# Patient Record
Sex: Female | Born: 1980 | Race: Black or African American | Hispanic: No | Marital: Married | Smoking: Never smoker
Health system: Southern US, Community
[De-identification: ages and names within clinical notes are randomized; demographics above are authoritative.]

## PROBLEM LIST (undated history)

## (undated) DIAGNOSIS — I10 Essential (primary) hypertension: Secondary | ICD-10-CM

---

## 2015-05-21 ENCOUNTER — Encounter (HOSPITAL_BASED_OUTPATIENT_CLINIC_OR_DEPARTMENT_OTHER): Payer: Self-pay | Admitting: *Deleted

## 2015-05-21 ENCOUNTER — Emergency Department (HOSPITAL_BASED_OUTPATIENT_CLINIC_OR_DEPARTMENT_OTHER)
Admission: EM | Admit: 2015-05-21 | Discharge: 2015-05-21 | Disposition: A | Discharge status: Home / Self Care | Payer: Self-pay | Attending: Emergency Medicine | Admitting: Emergency Medicine

## 2015-05-21 ENCOUNTER — Emergency Department (HOSPITAL_BASED_OUTPATIENT_CLINIC_OR_DEPARTMENT_OTHER): Payer: Self-pay

## 2015-05-21 DIAGNOSIS — J0110 Acute frontal sinusitis, unspecified: Secondary | ICD-10-CM | POA: Insufficient documentation

## 2015-05-21 DIAGNOSIS — Z3202 Encounter for pregnancy test, result negative: Secondary | ICD-10-CM | POA: Insufficient documentation

## 2015-05-21 DIAGNOSIS — R519 Headache, unspecified: Secondary | ICD-10-CM

## 2015-05-21 DIAGNOSIS — R030 Elevated blood-pressure reading, without diagnosis of hypertension: Secondary | ICD-10-CM | POA: Insufficient documentation

## 2015-05-21 DIAGNOSIS — L0390 Cellulitis, unspecified: Secondary | ICD-10-CM

## 2015-05-21 DIAGNOSIS — IMO0001 Reserved for inherently not codable concepts without codable children: Secondary | ICD-10-CM

## 2015-05-21 DIAGNOSIS — R51 Headache: Secondary | ICD-10-CM

## 2015-05-21 LAB — BASIC METABOLIC PANEL
Anion gap: 6 (ref 5–15)
BUN: 5 mg/dL — AB (ref 6–20)
CO2: 26 mmol/L (ref 22–32)
CREATININE: 0.69 mg/dL (ref 0.44–1.00)
Calcium: 9.6 mg/dL (ref 8.9–10.3)
Chloride: 109 mmol/L (ref 101–111)
GFR calc Af Amer: >60 mL/min (ref >60)
Glucose, Bld: 93 mg/dL (ref 65–99)
Potassium: 3.5 mmol/L (ref 3.5–5.1)
SODIUM: 141 mmol/L (ref 135–145)

## 2015-05-21 LAB — CBC WITH DIFFERENTIAL/PLATELET
BASOS PCT: 0 %
Basophils Absolute: 0 10*3/uL (ref 0.0–0.1)
EOS ABS: 0.1 10*3/uL (ref 0.0–0.7)
EOS PCT: 2 %
HCT: 38.2 % (ref 36.0–46.0)
Hemoglobin: 12.5 g/dL (ref 12.0–15.0)
LYMPHS ABS: 2.6 10*3/uL (ref 0.7–4.0)
Lymphocytes Relative: 32 %
MCH: 26.2 pg (ref 26.0–34.0)
MCHC: 32.7 g/dL (ref 30.0–36.0)
MCV: 79.9 fL (ref 78.0–100.0)
MONOS PCT: 9 %
Monocytes Absolute: 0.7 10*3/uL (ref 0.1–1.0)
Neutro Abs: 4.7 10*3/uL (ref 1.7–7.7)
Neutrophils Relative %: 57 %
PLATELETS: 261 10*3/uL (ref 150–400)
RBC: 4.78 MIL/uL (ref 3.87–5.11)
RDW: 15.2 % (ref 11.5–15.5)
WBC: 8.2 10*3/uL (ref 4.0–10.5)

## 2015-05-21 LAB — PREGNANCY, URINE: Preg Test, Ur: NEGATIVE

## 2015-05-21 MED ORDER — FLUORESCEIN SODIUM 1 MG OP STRP
2.0000 | ORAL_STRIP | Freq: Once | OPHTHALMIC | Status: AC
  Administered 2015-05-21: 2 via OPHTHALMIC
  Filled 2015-05-21: qty 2

## 2015-05-21 MED ORDER — CLONIDINE HCL 0.1 MG PO TABS
0.1000 mg | ORAL_TABLET | Freq: Once | ORAL | Status: AC
  Administered 2015-05-21: 0.1 mg via ORAL
  Filled 2015-05-21: qty 1

## 2015-05-21 MED ORDER — AMOXICILLIN 500 MG PO CAPS: 1000.0000 mg | ORAL_CAPSULE | Freq: Two times a day (BID) | ORAL | Status: DC

## 2015-05-21 MED ORDER — SODIUM CHLORIDE 0.9 % IV BOLUS (SEPSIS)
1000.0000 mL | Freq: Once | INTRAVENOUS | Status: AC
  Administered 2015-05-21: 1000 mL via INTRAVENOUS

## 2015-05-21 MED ORDER — PROCHLORPERAZINE MALEATE 10 MG PO TABS: 10.0000 mg | ORAL_TABLET | Freq: Four times a day (QID) | ORAL | Status: DC | PRN

## 2015-05-21 MED ORDER — IOHEXOL 300 MG/ML  SOLN
80.0000 mL | Freq: Once | INTRAMUSCULAR | Status: AC | PRN
  Administered 2015-05-21: 80 mL via INTRAVENOUS

## 2015-05-21 MED ORDER — PROCHLORPERAZINE EDISYLATE 5 MG/ML IJ SOLN
10.0000 mg | Freq: Once | INTRAMUSCULAR | Status: AC
  Administered 2015-05-21: 10 mg via INTRAVENOUS
  Filled 2015-05-21: qty 2

## 2015-05-21 MED ORDER — TETRACAINE HCL 0.5 % OP SOLN
2.0000 [drp] | Freq: Once | OPHTHALMIC | Status: AC
  Administered 2015-05-21: 2 [drp] via OPHTHALMIC
  Filled 2015-05-21: qty 4

## 2015-05-21 NOTE — ED Provider Notes (Signed)
CSN: [id number]     Arrival date & time 05/21/15  1259 History   First MD Initiated Contact with Patient 05/21/15 1352     Chief Complaint  Patient presents with  . Headache     (Consider location/radiation/quality/duration/timing/severity/associated sxs/prior Treatment) HPI  Blood pressure 161/108, pulse 80, temperature 98.4 F (36.9 C), temperature source Oral, resp. rate 16, height 5\' 4"  (1.626 m), weight 81.194 kg, last menstrual period 05/12/2015, SpO2 100 %.  Ranyah Groeneveld is a 35 y.o. female complaining of severe bilateral temporal headache, rated at 8 out of 10, pain is severe and wakes her from sleep at night, patient has history of headaches but none of this character or severity. She's been taking Aleve at home with little relief. She denies change in vision, fever, chills, cervicalgia, thunderclap onset, dysarthria, ataxia, cervicalgia. Patient does not have a diagnosis of hypertension, primary care is at [facility]. She does wear contact lenses.  States that when she woke up this morning, the right eye was swollen but this is improved throughout the day  History reviewed. No pertinent past medical history. Past Surgical History  Procedure Laterality Date  . Cesarean section     No family history on file. Social History  Substance Use Topics  . Smoking status: Never Smoker   . Smokeless tobacco: None  . Alcohol Use: No   OB History    No data available     Review of Systems  10 systems reviewed and found to be negative, except as noted in the HPI.   Allergies  Review of patient's allergies indicates no known allergies.  Home Medications   Prior to Admission medications   Not on File   BP 161/108 mmHg  Pulse 80  Temp(Src) 98.4 F (36.9 C) (Oral)  Resp 16  Ht 5\' 4"  (1.626 m)  Wt 81.194 kg  BMI 30.71 kg/m2  SpO2 100%  LMP 05/12/2015 Physical Exam  Constitutional: She is oriented to person, place, and time. She appears well-developed and  well-nourished. No distress.  HENT:  Head: Normocephalic and atraumatic.  Mouth/Throat: Oropharynx is clear and moist.  No drooling or stridor. Posterior pharynx mildly erythematous no significant tonsillar hypertrophy. No exudate. Soft palate rises symmetrically. No TTP or induration under tongue.   + Frontal sinus tenderness  No tenderness to palpation bilateral maxillary sinuses.  No mucosal edema in the nares.  Bilateral tympanic membranes with normal architecture and good light reflex.    Eyes: Conjunctivae and EOM are normal. Pupils are equal, round, and reactive to light.  Right eye pain with extraocular movement, lids and lashes normal. No conjunctival injection, no discharge, no abnormal uptake on fluorescein stain, corrected visual acuity 20 out of 20 bilaterally. Extraocular pressure is 19 mmHg with a 95% confidence interval.  Neck: Normal range of motion. Neck supple.  FROM to C-spine. Pt can touch chin to chest without discomfort. No TTP of midline cervical spine.   Cardiovascular: Normal rate, regular rhythm and intact distal pulses.   Pulmonary/Chest: Effort normal and breath sounds normal. No stridor. No respiratory distress. She has no wheezes. She has no rales. She exhibits no tenderness.  Abdominal: Soft. Bowel sounds are normal. She exhibits no distension and no mass. There is no tenderness. There is no rebound and no guarding.  Musculoskeletal: Normal range of motion. She exhibits no edema or tenderness.  Neurological: She is alert and oriented to person, place, and time. No cranial nerve deficit.  II-Visual fields grossly intact. III/IV/VI-Extraocular movements intact.  Pupils reactive bilaterally. V/VII-Smile symmetric, equal eyebrow raise,  facial sensation intact VIII- Hearing grossly intact IX/X-Normal gag XI-bilateral shoulder shrug XII-midline tongue extension Motor: 5/5 bilaterally with normal tone and bulk Cerebellar: Normal finger-to-nose  and normal  heel-to-shin test.   Romberg negative Ambulates with a coordinated gait   Psychiatric: She has a normal mood and affect.  Nursing note and vitals reviewed.   ED Course  Procedures (including critical care time) Labs Review Labs Reviewed - No data to display  Imaging Review No results found. I have personally reviewed and evaluated these images and lab results as part of my medical decision-making.   EKG Interpretation None      MDM   Final diagnoses:  None    Filed Vitals:   05/21/15 1306 05/21/15 1548 05/21/15 1630 05/21/15 1642  BP: 161/108 160/111 171/123 171/123  Pulse: 80 89    Temp: 98.4 F (36.9 C)     TempSrc: Oral     Resp: 16 17    Height: 5\' 4"  (1.626 m)     Weight: 81.194 kg     SpO2: 100% 99%      Medications  sodium chloride 0.9 % bolus 1,000 mL (not administered)  prochlorperazine (COMPAZINE) injection 10 mg (not administered)  fluorescein ophthalmic strip 2 strip (not administered)  tetracaine (PONTOCAINE) 0.5 % ophthalmic solution 2 drop (not administered)    Orrin Brighamatasha Mcpherson is 35 y.o. female presenting with  severe bilateral temporal headache, patient reports pain with eye movement, states that when she woke up this a.m. her right eye was swollen, this is resolved over the course of the day. Neuro exam is nonfocal, considering the atypical nature of this patient's headache I will scan her head and CT orbits with contrast to rule out a right sided orbital cellulitis. Patient's headache has completely resolved with fluids and Compazine. I exam is reassuring with normal acuity, pressure, anterior chamber with no cells or flare, no abnormal uptake of fluorescein stain. CT negative for acute abnormality. This patient's blood pressure is significantly elevated, she states that she's had elevated blood pressure in the past but has never been prescribed any medications for hypertension. The blood pressure remains elevated even after her headache has completely  resolved. Patient is given a dose of clonidine in the ED, discussed with Dr. Rosalia Hammersay who agrees patient stable for discharge, she can follow with her primary care physician tomorrow a.m. We've had an excessive discussion of return precautions and patient verbalizes her understanding. Patient has some tenderness palpation along the frontal sinuses, we'll treat for sinusitis.  Evaluation does not show pathology that would require ongoing emergent intervention or inpatient treatment. Pt is hemodynamically stable and mentating appropriately. Discussed findings and plan with patient/guardian, who agrees with care plan. All questions answered. Return precautions discussed and outpatient follow up given.   Discharge Medication List as of 05/21/2015  4:19 PM    START taking these medications   Details  amoxicillin (AMOXIL) 500 MG capsule Take 2 capsules (1,000 mg total) by mouth 2 (two) times daily., Starting 05/21/2015, Until Discontinued, [employer]    prochlorperazine (COMPAZINE) 10 MG tablet Take 1 tablet (10 mg total) by mouth every 6 (six) hours as needed (For headache)., Starting 05/21/2015, Until Discontinued, [employer]             Roshunda Keir, PA-C 05/21/15 1649  Rolan BuccoMelanie Belfi, MD 05/22/15 2109

## 2015-05-21 NOTE — ED Notes (Signed)
Patient asked to change into a gown.  

## 2015-05-21 NOTE — ED Notes (Signed)
Headache x 3 days. Chills. Her right eye was swollen when she woke up this am.

## 2015-05-21 NOTE — Discharge Instructions (Signed)
Use nasal saline (you can try Arm and Hammer Simply Saline) at least 4 times a day, use saline 5-10 minutes before using the fluticasone (flonase) nasal spray  Do not use Afrin (Oxymetazoline)  Rest, wash hands frequently  and drink plenty of water.  You may try counter medication such as Mucinex or Sudafed decongestant.  Please follow with your primary care doctor in the next 5 days for high blood pressure evaluation. If you do not have a primary care doctor, present to urgent care. Reduce salt intake. Seek emergency medical care for unilateral weakness, slurring, change in vision, or chest pain and shortness of breath.  Do not hesitate to return to the emergency room for any new, worsening or concerning symptoms.  Please obtain primary care using resource guide below. Let them know that you were seen in the emergency room and that they will need to obtain records for further outpatient management.     Sinus Headache A sinus headache occurs when the paranasal sinuses become clogged or swollen. Paranasal sinuses are air pockets within the bones of the face. Sinus headaches can range from mild to severe. CAUSES A sinus headache can result from various conditions that affect the sinuses, such as:  Colds.  Sinus infections.  Allergies. SYMPTOMS The main symptom of this condition is a headache that may feel like pain or pressure in the face, forehead, ears, or upper teeth. People who have a sinus headache often have other symptoms, such as:  Congested or runny nose.  Fever.  Inability to smell. Weather changes can make symptoms worse. DIAGNOSIS This condition may be diagnosed based on:  A physical exam and medical history.  Imaging tests, such as a CT scan and MRI, to check for problems with the sinuses.  A specialist may look into the sinuses with a tool that has a camera (endoscopy). TREATMENT Treatment for this condition depends on the cause.  Sinus pain that is caused  by a sinus infection may be treated with antibiotic medicine.  Sinus pain that is caused by allergies may be helped by allergy medicines (antihistamines) and medicated nasal sprays.  Sinus pain that is caused by congestion may be helped by flushing the nose and sinuses with saline solution. HOME CARE INSTRUCTIONS  Take medicines only as directed by your health care provider.  If you were prescribed an antibiotic medicine, finish all of it even if you start to feel better.  If you have congestion, use a nasal spray to help reduce pressure.  If directed, apply a warm, moist washcloth to your face to help relieve pain. SEEK MEDICAL CARE IF:  You have headaches more than one time each week.  You have sensitivity to light or sound.  You have a fever.  You feel sick to your stomach (nauseous) or you throw up (vomit).  Your headaches do not get better with treatment. Many people think that they have a sinus headache when they actually have migraines or tension headaches. SEEK IMMEDIATE MEDICAL CARE IF:  You have vision problems.  You have sudden, severe pain in your face or head.  You have a seizure.  You are confused.  You have a stiff neck.   This information is not intended to replace advice given to you by your health care provider. Make sure you discuss any questions you have with your health care provider.   Document Released: 06/12/2004 Document Revised: 09/19/2014 Document Reviewed: 05/01/2014 Elsevier Interactive Patient Education 2016 [employer]. Emergency Department Resource Guide  1) Find a Librarian, academic and Pay Out of Pocket Although you won't have to find out who is covered by your insurance plan, it is a good idea to ask around and get recommendations. You will then need to call the office and see if the doctor you have chosen will accept you as a new patient and what types of options they offer for patients who are self-pay. Some doctors offer discounts or will set up  payment plans for their patients who do not have insurance, but you will need to ask so you aren't surprised when you get to your appointment.  2) Contact Your Local Health Department Not all health departments have doctors that can see patients for sick visits, but many do, so it is worth a call to see if yours does. If you don't know where your local health department is, you can check in your phone book. The CDC also has a tool to help you locate your state's health department, and many state websites also have listings of all of their local health departments.  3) Find a Walk-in Clinic If your illness is not likely to be very severe or complicated, you may want to try a walk in clinic. These are popping up all over the country in pharmacies, drugstores, and shopping centers. They're usually staffed by nurse practitioners or physician assistants that have been trained to treat common illnesses and complaints. They're usually fairly quick and inexpensive. However, if you have serious medical issues or chronic medical problems, these are probably not your best option.  No Primary Care Doctor: - Call Health Connect at  [phone] - they can help you locate a primary care doctor that  accepts your insurance, provides certain services, etc. - Physician Referral Service- [phone]  Chronic Pain Problems: Organization         Address  Phone   Notes  [facility]  [phone] Patients need to be referred by their primary care doctor.   Medication Assistance: Organization         Address  Phone   Notes  [facility] Medication [facility] [address] [place], [place] [address] [phone] --Must be a resident of [facility] -- Must have NO insurance coverage whatsoever (no Medicaid/ Medicare, etc.) -- The pt. MUST have a primary care doctor that directs their care regularly and follows them in the community   [facility]ist  [phone]   [employer]  [phone]    Agencies that provide inexpensive medical care: Organization         Address  Phone   Notes  [facility]  [phone]   [facility]    [phone]   [facility] [address] [place], [place] [address] [phone]   [facility] [address] [place] [phone]   Planned Parenthood    [phone]   [facility]    [phone]   Community Health and [facility]  [address], [place] Phone:  [phone], Fax:  [phone] Hours of Operation:  9 am - 6 pm, M-F.  Also accepts Medicaid/Medicare and self-pay.  [facility]  [address], [place] Phone: [phone], Fax: [phone]. Hours of Operation:  8:30 am - 5:30 pm, M-F.  Also accepts Medicaid and self-pay.  [facility] [place] [address], [place] Phone: [phone]   [facility] [address] Aaliayah Bence [place], [place] [phone], Ext. [phone] Mondays & Thursdays: 7-9 AM.  First 15 patients are seen on a first come, first serve basis.    Medicaid-accepting [facility] Providers:  Organization         Address  Phone   Notes  [facility] [address], [place] [phone] Also accepts self-pay patients.  [facility] [address] Laurell Josephs [place], [place]  [phone]   [facility] [address], [place] [phone]   [facility] [address], [place] [phone]   Renaye Rakers [address], [place]   [phone] Only accepts [place] Access [place] patients after they have their name applied to their card.   Self-Pay (no insurance) in [facility]:  Organization         Address  Phone   Notes  Sickle Cell Patients, [facility] [address] [place], [place]  [phone]   [facility] [address] [place], [place] [phone]   [facility] [place]  [address], [place] [phone]   [facility]/Dr. Osei-Bonsu  [address] [place] or [address], [place] [phone] Phone number for both [place] and [place] locations is the same.  Urgent Medical and [facility] [address] [place] [phone]   [facility] [address], [place] or [address] [phone]   [facility] [address] [place] [phone], phone; [phone], fax Sees patients 1st and 3rd Saturday of every month.  Must not qualify for public or private insurance (i.e. Medicaid, Medicare, [place] Health Choice, Veterans' Benefits)  Household income should be no more than 200% of the poverty level The clinic cannot treat you if you are pregnant or think you are pregnant  Sexually transmitted diseases are not treated at the clinic.    Dental Care: Organization         Address  Phone  Notes  [facility] [address] [place], [place] [phone] Accepts children up to age 27 who are enrolled in [place] or [place] Health Choice; pregnant women with a Medicaid card; and children who have applied for Medicaid or [place] Health Choice, but were declined, whose parents can pay a reduced fee at time of service.  [facility] of [facility]  [address], [place] [phone] Accepts children up to age 64 who are enrolled in [place] or [place] Health Choice; pregnant women with a Medicaid card; and children who have applied for Medicaid or [place] Health Choice, but were declined, whose parents can pay a reduced fee at time of service.  [facility]  [address] [place], [place] [phone] Patients  are seen by appointment only. Walk-ins are not accepted. [facility] will see patients 65 years of age and older. Monday - Tuesday (8am-5pm) Most Wednesdays (8:30-5pm) $30 per visit, cash only  [facility] Adult [facility] Access [facility]  [address], [facility] [phone] Patients are seen by appointment only. Walk-ins are not accepted. [facility] will see patients 56 years of age and older. One Wednesday Evening (Monthly:  Volunteer Based).  $30 per visit, cash only  [employer] [facility]  [phone] for adults; Children under age 704, call Graduate Pediatric Dentistry at [phone]. Children aged 114-14, please call [phone]) [phone] to request a pediatric application.  Dental services are provided in all areas of dental care including fillings, crowns and bridges, complete and partial dentures, implants, gum treatment, root canals, and extractions. Preventive care is also provided. Treatment is provided to both adults and children. Patients are selected via a lottery and there is often a waiting list.   [facility] [address], [place]  [phone] [email or web address]   [facility] [address], [place], [place] [phone], Ext. [phone] Second and Fourth Thursday of each month, opens at 6:30 AM; Clinic ends at 9 AM.  Patients are seen on a first-come first-served basis, and a limited number are seen during each clinic.   [facility]  [address], [place], [place] [phone]   Eligibility Requirements You must have lived in [place], [place], or [place] for at least the last three months.   You cannot be eligible for state or federal sponsored [place]healthcare insurance, including CIG[employer]eterans Administration, [place]icaid, or [employer]icare.   You generally cannot be eligible for healthcare insurance through your employer.    How to apply: Eligibility screenings are held every Tuesday and Wednesday afternoon from 1:00 pm until  4:00 pm. You do not need an appointment for the interview!  [facility] [address], [place], [place] [phone]   [facility]  [phone]   [facility]  [phone]   [facility]  [phone]    Behavioral Health Resources in the Community: Intensive Outpatient Programs Organization         Address  Phone  Notes  [facility] [address], [place], [place] [phone]   [facility] [address], [place], [place] [phone]   [facility] [address], [place], [place]  [phone]   [facility] [address],  [place], [place] [phone] or [phone]   Substance Abuse Resources Organization         Address  Phone  Notes  [facility]  [phone]   [facility]  [phone]   [facility]  [phone]   [facility]  [phone]   Residential & Outpatient Substance Abuse Program  [phone]   Psychological Services Organization         Address  Phone  Notes  [facility]  [phone]- [phone]   [facility]  [phone]- [phone]   [facility] [address], [place] [phone] or [phone]    Mobile Crisis Teams Organization         Address  Phone  Notes  [facility], Mobile Crisis Care Unit  [phone]   [facility]  [address] [place], [place] [phone]   Doristine LocksSharon DeEsch [address] [place] [phone]    Self-Help/Support Groups Organization         Address  Phone             Notes  [facility] [place] - variety of support groups  336- [phone] Call for more information  Narcotics Anonymous (NA), [facility]aring [facility] [address], [place]  2 meetings at this location   Residential Treatment  Programs Organization          Address  Phone  Notes  [facility] [address],    [place]  [phone]   [facility]  [address], [place] [address], [place], [place] [phone]   [facility] [address] [place], [place] [phone] Admissions: 8am-3pm M-F  [facility] [address],    [place], [place] [phone]   [facility] [address] [place], [place], [place] [phone]   [facility] [address],  [place], [place] [phone]   [facility] - Intensive Outpatient [address]ure[address] Joseph[address], [place], [place] [phone]   [facility] ([facility] [address] [place].,  [place], [place] [phone] or [phone]   [facility] ([facility]) [address] [place], [place] [phone] Accepts Medicaid  [facility] [address],  [place] [phone] Substance Abuse/Addiction Treatment   [facility] Organization         Address  Phone  Notes  [facility]  [phone]   Angie Fava, PhD [address] Ervin Knack [place], [place]   [phone] or [phone]   [facility]   [address] [place], [place] [phone]   [facility] [address], [place], [place] [phone] Insurance/Medicaid/sponsorship through [facility] [address]                                    [place], [place] [phone] Therapy/tele-psych/case  [facility] [address], [place] [phone]    Dr. Lolly Mustache  [phone]   [facility]  [employer] [facility] Dept. 1) [address] [place] 2) [address] [place] 3)  [address], [place] [phone]  [phone]   [facility] Child Abuse Hotline 940-411-9993 or [phone] (After Hours)

## 2015-05-21 NOTE — ED Notes (Signed)
Pt. Able to walk steady gait and speak clear speech with no difficulty swallowing. Pt. Reports no trouble eating or drinking just headache.  Pt. Wears contacts and drove self to ED today.

## 2017-01-06 IMAGING — CT CT HEAD W/O CM
2 of 4 series · 13 of 47 positions shown, 16 images · non-contrast
Comparison: None.

CLINICAL DATA: RIGHT eye swallowing since earlier today. Evaluate
for abscess.

EXAM:
CT HEAD AND ORBITS WITHOUT CONTRAST
TECHNIQUE: Contiguous axial images were obtained from the base of the skull
through the vertex without contrast. Multidetector CT imaging of the
orbits was performed using the standard protocol without intravenous
contrast.

[Series 2: head wo · axial · 0.43mm/px · z∈[+1134,+1260]mm · 10 of 33 slices shown, 13 images]
[im 3/33  brain]
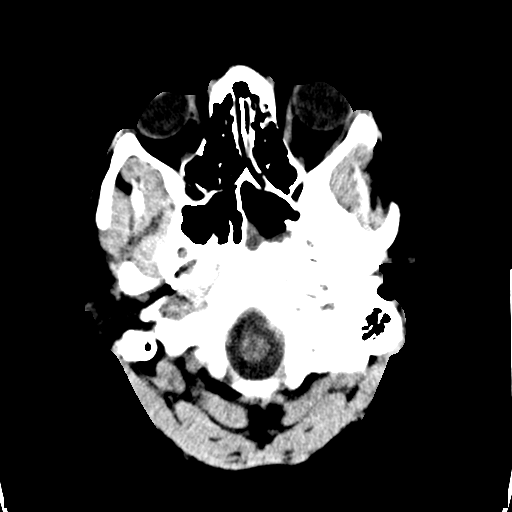
[im 3/33  bone]
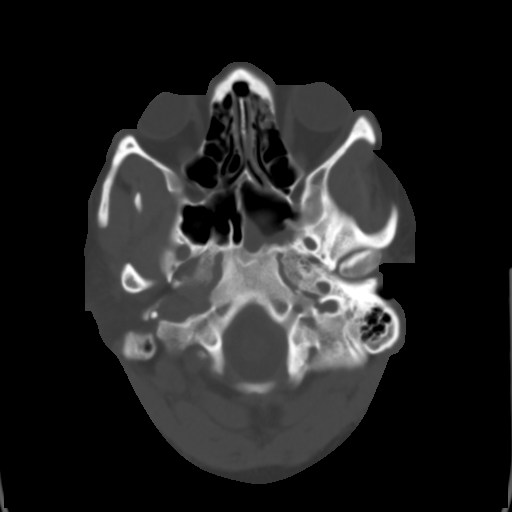
[im 6/33  brain]
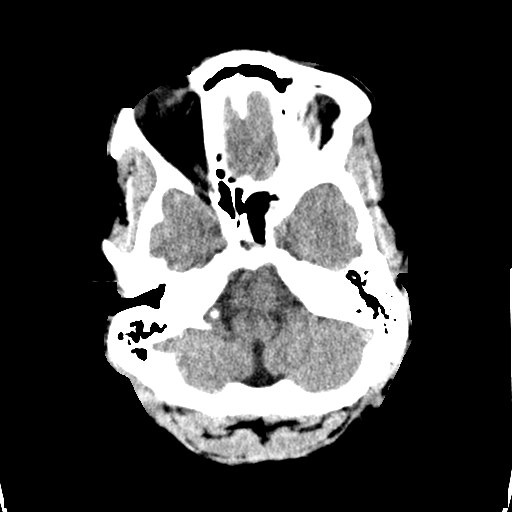
[im 9/33  brain]
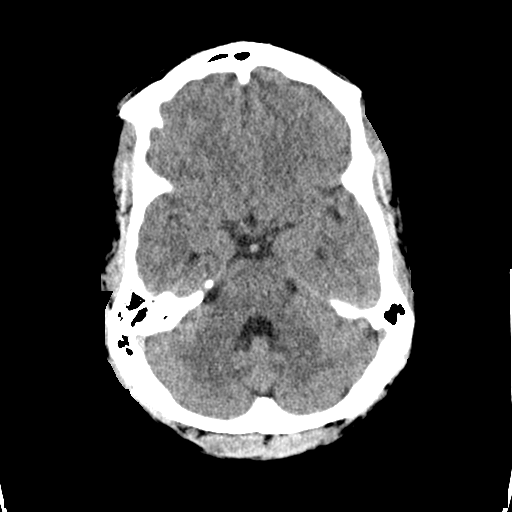
[im 11/33  brain]
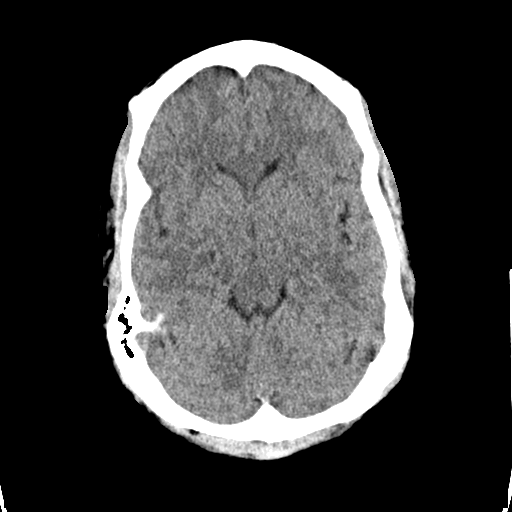
[im 14/33  brain]
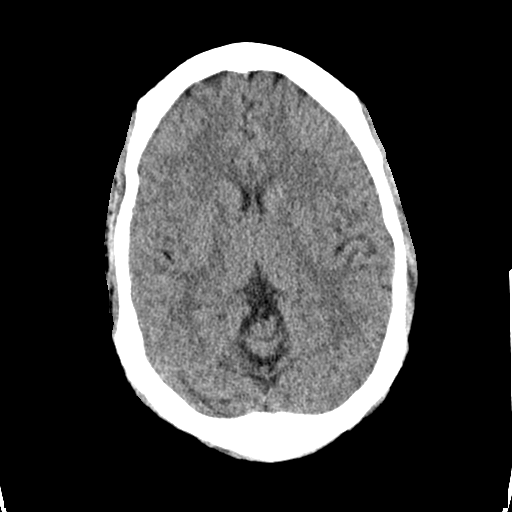
[im 14/33  bone]
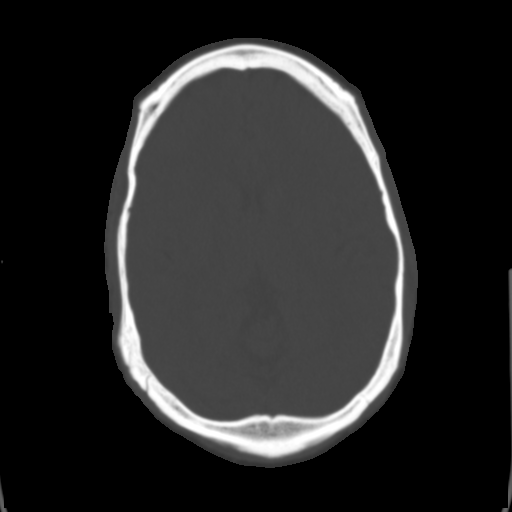
[im 19/33  brain]
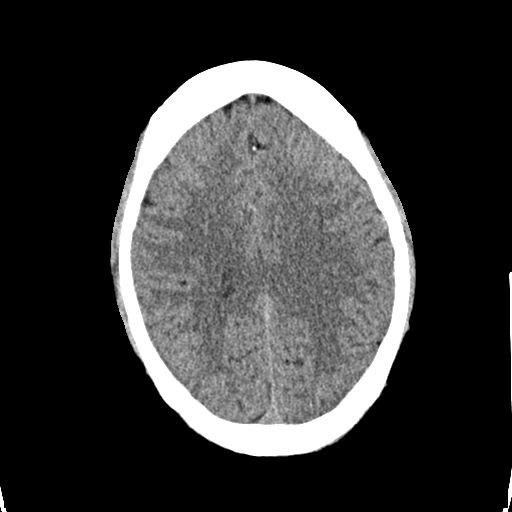
[im 22/33  brain]
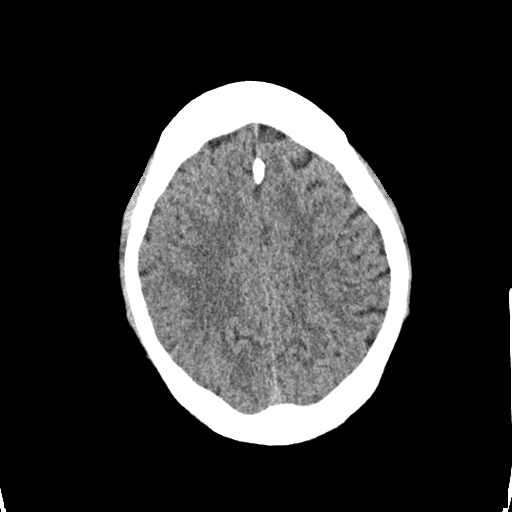
[im 25/33  brain]
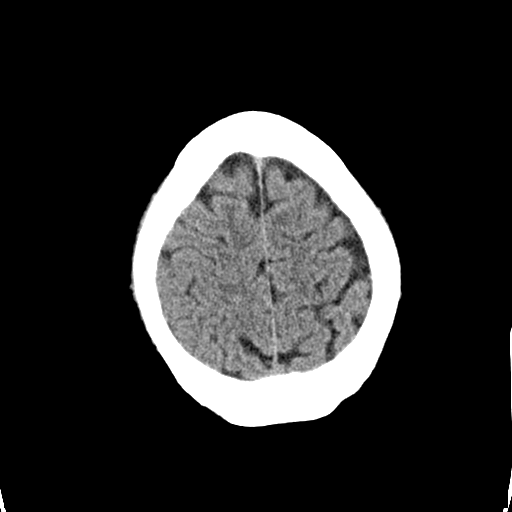
[im 27/33  brain]
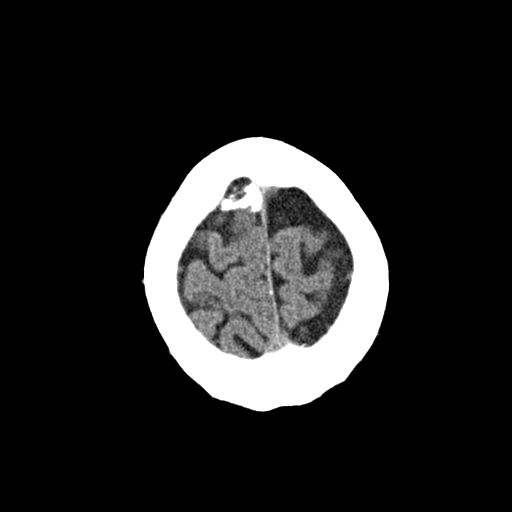
[im 27/33  bone]
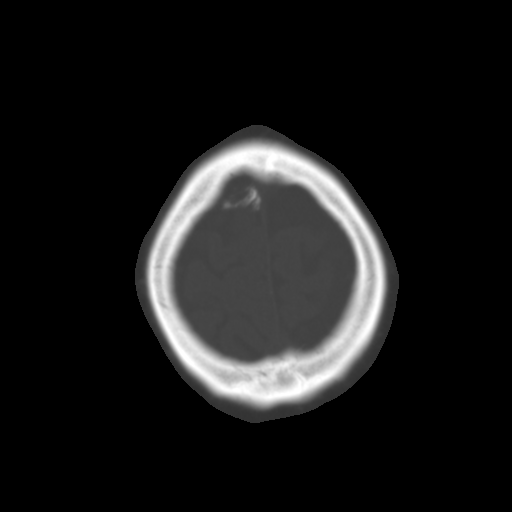
[im 30/33  brain]
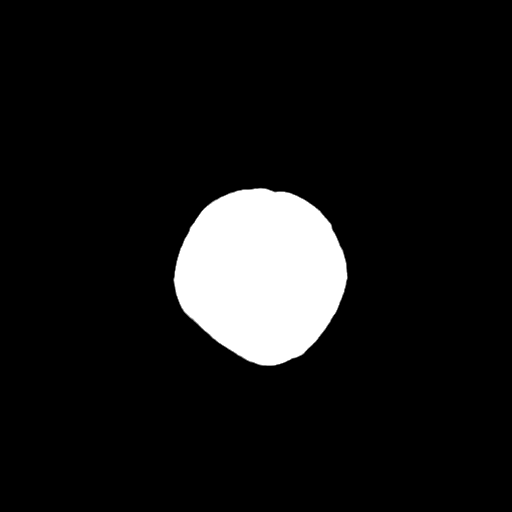

[Series 10: orbits 2.0 coronal · coronal · 0.18mm/px · 3 of 63 slices shown]
[im 21/63  brain]
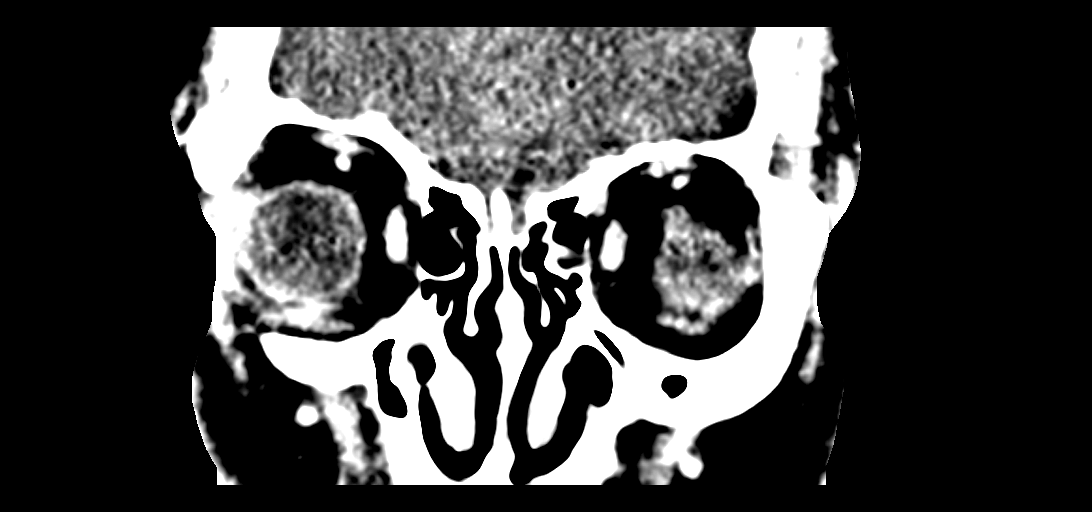
[im 28/63  brain]
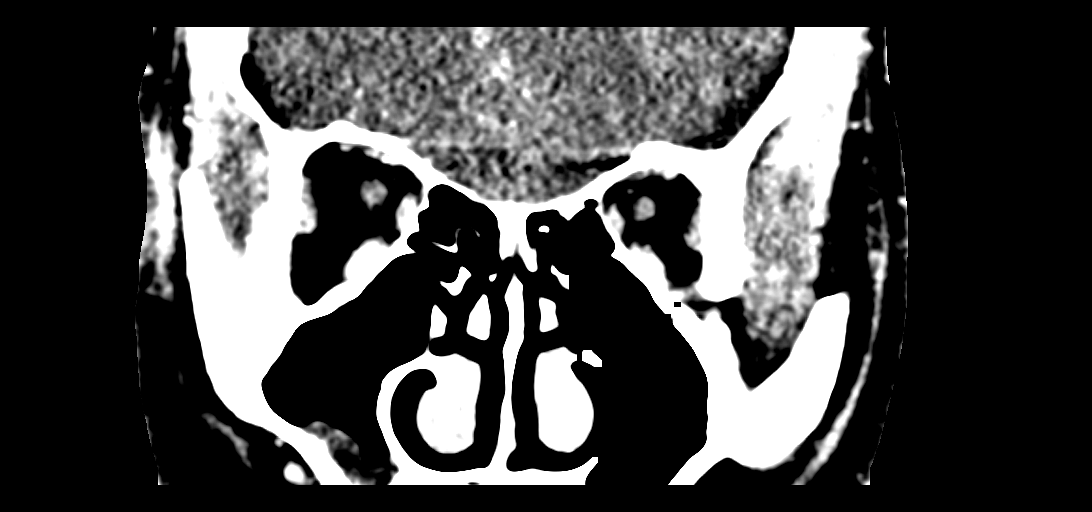
[im 35/63  brain]
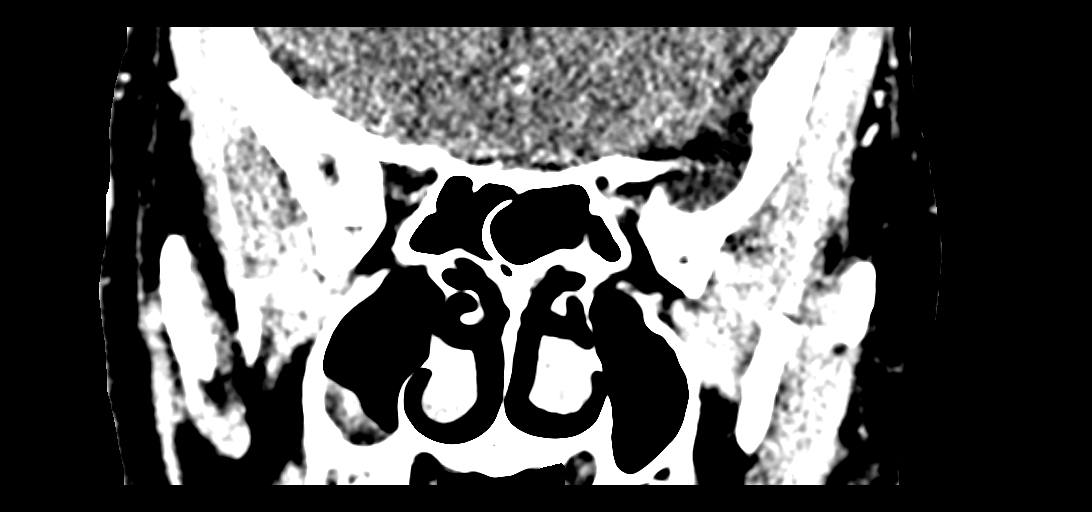

[13 of 47 positions shown; findings below may reference images not displayed]

FINDINGS: CT HEAD FINDINGS

No evidence for acute infarction, hemorrhage, mass lesion,
hydrocephalus, or extra-axial fluid. Normal cerebral volume. No
white matter disease. Calvarium is intact. Chronic LEFT division
sinus disease. Mild mucosal thickening in the LEFT ethmoid air
cells. BILATERAL chronic RIGHT greater than LEFT maxillary sinus
disease.

CT ORBITS FINDINGS

Globes are symmetric. Normal orbital musculature. No subperiosteal
or orbital abscess. No intraconal inflammation. Gaze appears
conjugate and both lens appear normally located. No orbital masses.
No enlargement of the superior ophthalmic vein on the RIGHT or LEFT.
Visualized intracranial optic pathways grossly negative.

No appreciable swelling on the RIGHT or LEFT. No preseptal
inflammation is present. No facial abscess or significant fat
stranding is observed. No evidence for blowout injury.
IMPRESSION: No acute intracranial findings.

No evidence for orbital or subperiosteal abscess. No orbital masses
are seen. No evidence for orbital inflammation.

Mild chronic sinus disease.

No preseptal soft tissue swelling or other facial inflammatory
process is observed.

## 2020-07-17 HISTORY — PX: SHOULDER SURGERY: SHX246

## 2020-07-28 ENCOUNTER — Observation Stay (HOSPITAL_COMMUNITY)
Admission: EM | Admit: 2020-07-28 | Discharge: 2020-07-29 | Disposition: A | Discharge status: Home / Self Care | Payer: PRIVATE HEALTH INSURANCE | Attending: Orthopedic Surgery | Admitting: Orthopedic Surgery

## 2020-07-28 ENCOUNTER — Emergency Department (HOSPITAL_COMMUNITY): Payer: Self-pay | Admitting: Anesthesiology

## 2020-07-28 ENCOUNTER — Encounter (HOSPITAL_COMMUNITY): Payer: Self-pay | Admitting: Emergency Medicine

## 2020-07-28 ENCOUNTER — Encounter (HOSPITAL_COMMUNITY)
Admission: EM | Disposition: A | Discharge status: Home / Self Care | Payer: Self-pay | Source: Home / Self Care | Attending: Emergency Medicine

## 2020-07-28 ENCOUNTER — Emergency Department (HOSPITAL_COMMUNITY): Payer: Self-pay

## 2020-07-28 ENCOUNTER — Other Ambulatory Visit: Payer: Self-pay

## 2020-07-28 DIAGNOSIS — S71152A Open bite, left thigh, initial encounter: Secondary | ICD-10-CM

## 2020-07-28 DIAGNOSIS — S81852A Open bite, left lower leg, initial encounter: Secondary | ICD-10-CM | POA: Insufficient documentation

## 2020-07-28 DIAGNOSIS — W540XXA Bitten by dog, initial encounter: Secondary | ICD-10-CM | POA: Diagnosis not present

## 2020-07-28 DIAGNOSIS — Z79899 Other long term (current) drug therapy: Secondary | ICD-10-CM | POA: Insufficient documentation

## 2020-07-28 DIAGNOSIS — S41151A Open bite of right upper arm, initial encounter: Secondary | ICD-10-CM

## 2020-07-28 DIAGNOSIS — S41051A Open bite of right shoulder, initial encounter: Principal | ICD-10-CM | POA: Insufficient documentation

## 2020-07-28 DIAGNOSIS — Z9889 Other specified postprocedural states: Secondary | ICD-10-CM

## 2020-07-28 DIAGNOSIS — Z20822 Contact with and (suspected) exposure to covid-19: Secondary | ICD-10-CM | POA: Insufficient documentation

## 2020-07-28 DIAGNOSIS — I10 Essential (primary) hypertension: Secondary | ICD-10-CM | POA: Insufficient documentation

## 2020-07-28 DIAGNOSIS — Z23 Encounter for immunization: Secondary | ICD-10-CM | POA: Insufficient documentation

## 2020-07-28 HISTORY — PX: I & D EXTREMITY: SHX5045

## 2020-07-28 LAB — CBC WITH DIFFERENTIAL/PLATELET
Abs Immature Granulocytes: 0.04 10*3/uL (ref 0.00–0.07)
Basophils Absolute: 0.1 10*3/uL (ref 0.0–0.1)
Basophils Relative: 1 %
Eosinophils Absolute: 0.2 10*3/uL (ref 0.0–0.5)
Eosinophils Relative: 2 %
HCT: 37.7 % (ref 36.0–46.0)
Hemoglobin: 12.4 g/dL (ref 12.0–15.0)
Immature Granulocytes: 0 %
Lymphocytes Relative: 32 %
Lymphs Abs: 4.2 10*3/uL — ABNORMAL HIGH (ref 0.7–4.0)
MCH: 26.4 pg (ref 26.0–34.0)
MCHC: 32.9 g/dL (ref 30.0–36.0)
MCV: 80.2 fL (ref 80.0–100.0)
Monocytes Absolute: 1.3 10*3/uL — ABNORMAL HIGH (ref 0.1–1.0)
Monocytes Relative: 10 %
Neutro Abs: 7.3 10*3/uL (ref 1.7–7.7)
Neutrophils Relative %: 55 %
Platelets: 280 10*3/uL (ref 150–400)
RBC: 4.7 MIL/uL (ref 3.87–5.11)
RDW: 16.7 % — ABNORMAL HIGH (ref 11.5–15.5)
WBC: 13 10*3/uL — ABNORMAL HIGH (ref 4.0–10.5)
nRBC: 0 % (ref 0.0–0.2)

## 2020-07-28 LAB — BASIC METABOLIC PANEL
Anion gap: 10 (ref 5–15)
BUN: 7 mg/dL (ref 6–20)
CO2: 22 mmol/L (ref 22–32)
Calcium: 9.7 mg/dL (ref 8.9–10.3)
Chloride: 104 mmol/L (ref 98–111)
Creatinine, Ser: 0.69 mg/dL (ref 0.44–1.00)
GFR, Estimated: >60 mL/min (ref >60)
Glucose, Bld: 113 mg/dL — ABNORMAL HIGH (ref 70–99)
Potassium: 3.2 mmol/L — ABNORMAL LOW (ref 3.5–5.1)
Sodium: 136 mmol/L (ref 135–145)

## 2020-07-28 LAB — RESP PANEL BY RT-PCR (FLU A&B, COVID) ARPGX2
Influenza A by PCR: NEGATIVE
Influenza B by PCR: NEGATIVE
SARS Coronavirus 2 by RT PCR: NEGATIVE

## 2020-07-28 LAB — I-STAT BETA HCG BLOOD, ED (MC, WL, AP ONLY): I-stat hCG, quantitative: <5 m[IU]/mL (ref <5)

## 2020-07-28 SURGERY — IRRIGATION AND DEBRIDEMENT EXTREMITY
Anesthesia: General | Site: Shoulder | Laterality: Bilateral

## 2020-07-28 MED ORDER — LORAZEPAM 2 MG/ML IJ SOLN: 2.0000 mg | Freq: Once | INTRAMUSCULAR | Status: DC

## 2020-07-28 MED ORDER — FENTANYL CITRATE (PF) 100 MCG/2ML IJ SOLN
50.0000 ug | Freq: Once | INTRAMUSCULAR | Status: AC
  Administered 2020-07-28: 50 ug via INTRAVENOUS
  Filled 2020-07-28: qty 2

## 2020-07-28 MED ORDER — ONDANSETRON HCL 4 MG/2ML IJ SOLN
INTRAMUSCULAR | Status: DC | PRN
  Administered 2020-07-28: 4 mg via INTRAVENOUS

## 2020-07-28 MED ORDER — CLONIDINE HCL (ANALGESIA) 100 MCG/ML EP SOLN
EPIDURAL | Status: AC
  Filled 2020-07-28: qty 10

## 2020-07-28 MED ORDER — LORAZEPAM 2 MG/ML IJ SOLN
1.0000 mg | Freq: Once | INTRAMUSCULAR | Status: AC
  Administered 2020-07-28: 1 mg via INTRAVENOUS
  Filled 2020-07-28: qty 1

## 2020-07-28 MED ORDER — FENTANYL CITRATE (PF) 250 MCG/5ML IJ SOLN
INTRAMUSCULAR | Status: DC | PRN
  Administered 2020-07-28: 50 ug via INTRAVENOUS
  Administered 2020-07-28 (×2): 100 ug via INTRAVENOUS

## 2020-07-28 MED ORDER — ALBUMIN HUMAN 5 % IV SOLN: INTRAVENOUS | Status: DC | PRN

## 2020-07-28 MED ORDER — LIDOCAINE 2% (20 MG/ML) 5 ML SYRINGE
INTRAMUSCULAR | Status: AC
  Filled 2020-07-28: qty 10

## 2020-07-28 MED ORDER — BUPIVACAINE HCL (PF) 0.5 % IJ SOLN
INTRAMUSCULAR | Status: DC | PRN
  Administered 2020-07-28: 30 mL

## 2020-07-28 MED ORDER — TETANUS-DIPHTH-ACELL PERTUSSIS 5-2.5-18.5 LF-MCG/0.5 IM SUSY
0.5000 mL | PREFILLED_SYRINGE | Freq: Once | INTRAMUSCULAR | Status: AC
  Administered 2020-07-28: 0.5 mL via INTRAMUSCULAR
  Filled 2020-07-28: qty 0.5

## 2020-07-28 MED ORDER — DIPHENHYDRAMINE HCL 50 MG/ML IJ SOLN
INTRAMUSCULAR | Status: DC | PRN
  Administered 2020-07-28: 12.5 mg via INTRAVENOUS

## 2020-07-28 MED ORDER — AMPICILLIN-SULBACTAM SODIUM 3 (2-1) G IJ SOLR
3.0000 g | Freq: Once | INTRAMUSCULAR | Status: AC
  Administered 2020-07-28: 3 g via INTRAVENOUS
  Filled 2020-07-28: qty 8

## 2020-07-28 MED ORDER — BUPIVACAINE HCL (PF) 0.5 % IJ SOLN
INTRAMUSCULAR | Status: AC
  Filled 2020-07-28: qty 30

## 2020-07-28 MED ORDER — SUGAMMADEX SODIUM 200 MG/2ML IV SOLN
INTRAVENOUS | Status: DC | PRN
  Administered 2020-07-28: 200 mg via INTRAVENOUS

## 2020-07-28 MED ORDER — PROPOFOL 10 MG/ML IV BOLUS
INTRAVENOUS | Status: AC
  Filled 2020-07-28: qty 20

## 2020-07-28 MED ORDER — MORPHINE SULFATE (PF) 4 MG/ML IV SOLN
INTRAVENOUS | Status: AC
  Filled 2020-07-28: qty 2

## 2020-07-28 MED ORDER — LACTATED RINGERS IV SOLN: INTRAVENOUS | Status: DC | PRN

## 2020-07-28 MED ORDER — MIDAZOLAM HCL 5 MG/5ML IJ SOLN
INTRAMUSCULAR | Status: DC | PRN
  Administered 2020-07-28: 2 mg via INTRAVENOUS

## 2020-07-28 MED ORDER — PROPOFOL 10 MG/ML IV BOLUS
INTRAVENOUS | Status: DC | PRN
  Administered 2020-07-28: 160 mg via INTRAVENOUS

## 2020-07-28 MED ORDER — MORPHINE SULFATE (PF) 4 MG/ML IV SOLN
INTRAVENOUS | Status: DC | PRN
  Administered 2020-07-28: 8 mg via INTRAMUSCULAR

## 2020-07-28 MED ORDER — ROCURONIUM BROMIDE 10 MG/ML (PF) SYRINGE
PREFILLED_SYRINGE | INTRAVENOUS | Status: AC
  Filled 2020-07-28: qty 20

## 2020-07-28 MED ORDER — FENTANYL CITRATE (PF) 250 MCG/5ML IJ SOLN
INTRAMUSCULAR | Status: AC
  Filled 2020-07-28: qty 5

## 2020-07-28 MED ORDER — SUCCINYLCHOLINE CHLORIDE 200 MG/10ML IV SOSY
PREFILLED_SYRINGE | INTRAVENOUS | Status: AC
  Filled 2020-07-28: qty 20

## 2020-07-28 MED ORDER — LIDOCAINE HCL (CARDIAC) PF 100 MG/5ML IV SOSY
PREFILLED_SYRINGE | INTRAVENOUS | Status: DC | PRN
  Administered 2020-07-28: 40 mg via INTRATRACHEAL

## 2020-07-28 MED ORDER — MIDAZOLAM HCL 2 MG/2ML IJ SOLN
INTRAMUSCULAR | Status: AC
  Filled 2020-07-28: qty 2

## 2020-07-28 MED ORDER — DIPHENHYDRAMINE HCL 50 MG/ML IJ SOLN
INTRAMUSCULAR | Status: AC
  Filled 2020-07-28: qty 1

## 2020-07-28 MED ORDER — HYDROMORPHONE HCL 1 MG/ML IJ SOLN
0.2500 mg | INTRAMUSCULAR | Status: DC | PRN
Start: 2020-07-28 — End: 2020-07-28

## 2020-07-28 MED ORDER — ONDANSETRON HCL 4 MG/2ML IJ SOLN
INTRAMUSCULAR | Status: AC
  Filled 2020-07-28: qty 2

## 2020-07-28 MED ORDER — CLONAZEPAM 0.1 MG/ML ORAL SUSPENSION
ORAL | Status: DC | PRN
  Administered 2020-07-28: 1 mg

## 2020-07-28 MED ORDER — ROCURONIUM BROMIDE 100 MG/10ML IV SOLN
INTRAVENOUS | Status: DC | PRN
  Administered 2020-07-28: 30 mg via INTRAVENOUS

## 2020-07-28 MED ORDER — SUCCINYLCHOLINE CHLORIDE 20 MG/ML IJ SOLN
INTRAMUSCULAR | Status: DC | PRN
  Administered 2020-07-28: 120 mg via INTRAVENOUS

## 2020-07-28 SURGICAL SUPPLY — 64 items
BNDG COHESIVE 4X5 TAN STRL (GAUZE/BANDAGES/DRESSINGS) IMPLANT
BNDG ELASTIC 4X5.8 VLCR STR LF (GAUZE/BANDAGES/DRESSINGS) IMPLANT
BNDG ELASTIC 6X10 VLCR STRL LF (GAUZE/BANDAGES/DRESSINGS) ×2 IMPLANT
BNDG GAUZE ELAST 4 BULKY (GAUZE/BANDAGES/DRESSINGS) IMPLANT
COVER SURGICAL LIGHT HANDLE (MISCELLANEOUS) ×2 IMPLANT
CUFF TOURN SGL QUICK 18X4 (TOURNIQUET CUFF) IMPLANT
CUFF TOURN SGL QUICK 24 (TOURNIQUET CUFF)
CUFF TOURN SGL QUICK 34 (TOURNIQUET CUFF)
CUFF TOURN SGL QUICK 42 (TOURNIQUET CUFF) IMPLANT
CUFF TRNQT CYL 24X4X16.5-23 (TOURNIQUET CUFF) IMPLANT
CUFF TRNQT CYL 34X4.125X (TOURNIQUET CUFF) IMPLANT
DRAIN PENROSE 1/4X12 LTX STRL (WOUND CARE) ×4 IMPLANT
DRAPE BILATERAL LIMB T (DRAPES) IMPLANT
DRAPE ORTHO SPLIT 77X108 STRL (DRAPES) ×2
DRAPE SURG ORHT 6 SPLT 77X108 (DRAPES) ×2 IMPLANT
DRAPE U-SHAPE 47X51 STRL (DRAPES) ×4 IMPLANT
DRSG PAD ABDOMINAL 8X10 ST (GAUZE/BANDAGES/DRESSINGS) IMPLANT
DRSG TEGADERM 4X4.5 CHG (GAUZE/BANDAGES/DRESSINGS) ×2 IMPLANT
DURAPREP 26ML APPLICATOR (WOUND CARE) ×2 IMPLANT
ELECT REM PT RETURN 9FT ADLT (ELECTROSURGICAL) ×2
ELECTRODE REM PT RTRN 9FT ADLT (ELECTROSURGICAL) ×1 IMPLANT
GAUZE SPONGE 4X4 12PLY STRL (GAUZE/BANDAGES/DRESSINGS) IMPLANT
GAUZE XEROFORM 5X9 LF (GAUZE/BANDAGES/DRESSINGS) IMPLANT
GLOVE ECLIPSE 7.0 STRL STRAW (GLOVE) ×4 IMPLANT
GLOVE ECLIPSE 8.0 STRL XLNG CF (GLOVE) ×4 IMPLANT
GLOVE SRG 8 PF TXTR STRL LF DI (GLOVE) ×1 IMPLANT
GLOVE SURG UNDER POLY LF SZ7 (GLOVE) ×2 IMPLANT
GLOVE SURG UNDER POLY LF SZ7.5 (GLOVE) ×2 IMPLANT
GLOVE SURG UNDER POLY LF SZ8 (GLOVE) ×1
GOWN STRL REUS W/ TWL LRG LVL3 (GOWN DISPOSABLE) ×2 IMPLANT
GOWN STRL REUS W/ TWL XL LVL3 (GOWN DISPOSABLE) ×1 IMPLANT
GOWN STRL REUS W/TWL LRG LVL3 (GOWN DISPOSABLE) ×2
GOWN STRL REUS W/TWL XL LVL3 (GOWN DISPOSABLE) ×1
HANDPIECE INTERPULSE COAX TIP (DISPOSABLE)
KIT BASIN OR (CUSTOM PROCEDURE TRAY) ×2 IMPLANT
KIT TURNOVER KIT B (KITS) ×2 IMPLANT
MANIFOLD NEPTUNE II (INSTRUMENTS) ×2 IMPLANT
NEEDLE 18GX1X1/2 (RX/OR ONLY) (NEEDLE) ×2 IMPLANT
NS IRRIG 1000ML POUR BTL (IV SOLUTION) ×2 IMPLANT
PACK ORTHO EXTREMITY (CUSTOM PROCEDURE TRAY) ×2 IMPLANT
PAD ABD 7.5X8 STRL (GAUZE/BANDAGES/DRESSINGS) ×4 IMPLANT
PAD ARMBOARD 7.5X6 YLW CONV (MISCELLANEOUS) ×4 IMPLANT
PAD CAST 4YDX4 CTTN HI CHSV (CAST SUPPLIES) IMPLANT
PADDING CAST COTTON 4X4 STRL (CAST SUPPLIES)
PADDING CAST COTTON 6X4 STRL (CAST SUPPLIES) ×2 IMPLANT
SET HNDPC FAN SPRY TIP SCT (DISPOSABLE) IMPLANT
SLING ARM IMMOBILIZER LRG (SOFTGOODS) ×2 IMPLANT
SPONGE LAP 18X18 RF (DISPOSABLE) IMPLANT
STOCKINETTE IMPERVIOUS 9X36 MD (GAUZE/BANDAGES/DRESSINGS) IMPLANT
SUT ETHILON 2 0 FS 18 (SUTURE) ×2 IMPLANT
SUT ETHILON 3 0 PS 1 (SUTURE) ×8 IMPLANT
SUT VIC AB 2-0 CT1 27 (SUTURE) ×3
SUT VIC AB 2-0 CT1 TAPERPNT 27 (SUTURE) ×3 IMPLANT
SUT VIC AB 3-0 SH 27 (SUTURE)
SUT VIC AB 3-0 SH 27X BRD (SUTURE) IMPLANT
SWAB CULTURE ESWAB REG 1ML (MISCELLANEOUS) IMPLANT
SWAB CULTURE LIQ STUART DBL (MISCELLANEOUS) IMPLANT
SYR CONTROL 10ML LL (SYRINGE) ×2 IMPLANT
TOWEL GREEN STERILE (TOWEL DISPOSABLE) ×2 IMPLANT
TOWEL GREEN STERILE FF (TOWEL DISPOSABLE) ×2 IMPLANT
TUBE CONNECTING 12X1/4 (SUCTIONS) ×2 IMPLANT
UNDERPAD 30X36 HEAVY ABSORB (UNDERPADS AND DIAPERS) ×2 IMPLANT
WATER STERILE IRR 1000ML POUR (IV SOLUTION) ×2 IMPLANT
YANKAUER SUCT BULB TIP NO VENT (SUCTIONS) ×2 IMPLANT

## 2020-07-28 NOTE — Anesthesia Postprocedure Evaluation (Signed)
Anesthesia Post Note  Patient: Tina Walker  Procedure(s) Performed: IRRIGATION AND DEBRIDEMENT RIGHT ANTERIOR SHOULDER AND LEFT  ANTERIOR THIGH (Bilateral Shoulder)     Patient location during evaluation: PACU Anesthesia Type: General Level of consciousness: awake and alert Pain management: pain level controlled Vital Signs Assessment: post-procedure vital signs reviewed and stable Respiratory status: spontaneous breathing, nonlabored ventilation and respiratory function stable Cardiovascular status: blood pressure returned to baseline and stable Postop Assessment: no apparent nausea or vomiting Anesthetic complications: no   No complications documented.  Last Vitals:  Vitals:   07/28/20 2220 07/28/20 2235  BP: 126/83 132/86  Pulse: 93 84  Resp: 18 18  Temp: 36.7 C 36.7 C  SpO2: 100% 100%    Last Pain:  Vitals:   07/28/20 2235  TempSrc:   PainSc: Asleep                 FITZGERALD,W. EDMOND

## 2020-07-28 NOTE — H&P (Signed)
Tina Walker is an 40 y.o. female.   Chief Complaint: Dog bite to multiple extremities HPI: Tina Walker is a 40 year old female with dog bite to multiple extremities.  Injured her right shoulder and left leg.  Dog supposedly by history is up-to-date on his shots.  Patient has been quite traumatized by the event.  She was given Unasyn approximately 1 hour ago in the emergency department.  Denies any other orthopedic complaints other than right shoulder pain and left leg pain.  History reviewed. No pertinent past medical history.  Past Surgical History:  Procedure Laterality Date  . CESAREAN SECTION      No family history on file. Social History:  reports that she has never smoked. She does not have any smokeless tobacco history on file. She reports that she does not drink alcohol and does not use drugs.  Allergies: No Known Allergies  (Not in a hospital admission)   Results for orders placed or performed during the hospital encounter of 07/28/20 (from the past 48 hour(s))  CBC with Differential     Status: Abnormal   Collection Time: 07/28/20  4:13 PM  Result Value Ref Range   WBC 13.0 (H) 4.0 - 10.5 K/uL   RBC 4.70 3.87 - 5.11 MIL/uL   Hemoglobin 12.4 12.0 - 15.0 g/dL   HCT 16.1 09.6 - 04.5 %   MCV 80.2 80.0 - 100.0 fL   MCH 26.4 26.0 - 34.0 pg   MCHC 32.9 30.0 - 36.0 g/dL   RDW 40.9 (H) 81.1 - 91.4 %   Platelets 280 150 - 400 K/uL   nRBC 0.0 0.0 - 0.2 %   Neutrophils Relative % 55 %   Neutro Abs 7.3 1.7 - 7.7 K/uL   Lymphocytes Relative 32 %   Lymphs Abs 4.2 (H) 0.7 - 4.0 K/uL   Monocytes Relative 10 %   Monocytes Absolute 1.3 (H) 0.1 - 1.0 K/uL   Eosinophils Relative 2 %   Eosinophils Absolute 0.2 0.0 - 0.5 K/uL   Basophils Relative 1 %   Basophils Absolute 0.1 0.0 - 0.1 K/uL   Immature Granulocytes 0 %   Abs Immature Granulocytes 0.04 0.00 - 0.07 K/uL    Comment: Performed at [facility], [address], [place], [place] [address]  Basic metabolic panel      Status: Abnormal   Collection Time: 07/28/20  4:13 PM  Result Value Ref Range   Sodium 136 135 - 145 mmol/L   Potassium 3.2 (L) 3.5 - 5.1 mmol/L   Chloride 104 98 - 111 mmol/L   CO2 22 22 - 32 mmol/L   Glucose, Bld 113 (H) 70 - 99 mg/dL    Comment: Glucose reference range applies only to samples taken after fasting for at least 8 hours.   BUN 7 6 - 20 mg/dL   Creatinine, Ser 6.21 0.44 - 1.00 mg/dL   Calcium 9.7 8.9 - 30.8 mg/dL   GFR, Estimated >65 >78 mL/min    Comment: (NOTE) Calculated using the CKD-EPI Creatinine Equation (2021)    Anion gap 10 5 - 15    Comment: Performed at [facility], [address] [place], [place] [address]  I-Stat beta hCG blood, ED     Status: None   Collection Time: 07/28/20  4:15 PM  Result Value Ref Range   I-stat hCG, quantitative <5.0 <5 mIU/mL   Comment 3            Comment:   GEST. AGE  CONC.  (mIU/mL)   <=1 WEEK        5 - 50     2 WEEKS       50 - 500     3 WEEKS       100 - 10,000     4 WEEKS     1,000 - 30,000        FEMALE AND NON-PREGNANT FEMALE:     LESS THAN 5 mIU/mL   Resp Panel by RT-PCR (Flu A&B, Covid) Nasopharyngeal Swab     Status: None   Collection Time: 07/28/20  6:24 PM   Specimen: Nasopharyngeal Swab; Nasopharyngeal(NP) swabs in vial transport medium  Result Value Ref Range   SARS Coronavirus 2 by RT PCR NEGATIVE NEGATIVE    Comment: (NOTE) SARS-CoV-2 target nucleic acids are NOT DETECTED.  The SARS-CoV-2 RNA is generally detectable in upper respiratory specimens during the acute phase of infection. The lowest concentration of SARS-CoV-2 viral copies this assay can detect is 138 copies/mL. A negative result does not preclude SARS-Cov-2 infection and should not be used as the sole basis for treatment or other patient management decisions. A negative result may occur with  improper specimen collection/handling, submission of specimen other than nasopharyngeal swab, presence of viral mutation(s) within  the areas targeted by this assay, and inadequate number of viral copies(<138 copies/mL). A negative result must be combined with clinical observations, patient history, and epidemiological information. The expected result is Negative.  Fact Sheet for Patients:  BloggerCourse.com  Fact Sheet for Healthcare Providers:  SeriousBroker.it  This test is no t yet approved or cleared by the [place] FDA and  has been authorized for detection and/or diagnosis of SARS-CoV-2 by FDA under an Emergency Use Authorization (EUA). This EUA will remain  in effect (meaning this test can be used) for the duration of the COVID-19 declaration under Section 564(b)(1) of the Act, 21 U.S.C.section 360bbb-3(b)(1), unless the authorization is terminated  or revoked sooner.       Influenza A by PCR NEGATIVE NEGATIVE   Influenza B by PCR NEGATIVE NEGATIVE    Comment: (NOTE) The Xpert Xpress SARS-CoV-2/FLU/RSV plus assay is intended as an aid in the diagnosis of influenza from Nasopharyngeal swab specimens and should not be used as a sole basis for treatment. Nasal washings and aspirates are unacceptable for Xpert Xpress SARS-CoV-2/FLU/RSV testing.  Fact Sheet for Patients: BloggerCourse.com  Fact Sheet for Healthcare Providers: SeriousBroker.it  This test is not yet approved or cleared by the [place] FDA and has been authorized for detection and/or diagnosis of SARS-CoV-2 by FDA under an Emergency Use Authorization (EUA). This EUA will remain in effect (meaning this test can be used) for the duration of the COVID-19 declaration under Section 564(b)(1) of the Act, 21 U.S.C. section 360bbb-3(b)(1), unless the authorization is terminated or revoked.  Performed at [facility], [address], [place], [place] [address]    DG Shoulder Right  Result Date: 07/28/2020 CLINICAL DATA:  Dog  bite EXAM: RIGHT SHOULDER - 2+ VIEW COMPARISON:  None. FINDINGS: There is no evidence of fracture or dislocation. There is no evidence of arthropathy or other focal bone abnormality. Large soft tissue defect overlying the right upper arm. IMPRESSION: 1. No fracture or dislocation of the right shoulder. Joint spaces are well preserved. 2. Large soft tissue defect overlying the right upper arm. Electronically Signed   By: Lauralyn Primes M.D.   On: 07/28/2020 17:25   DG Femur 1V Left  Result Date: 07/28/2020 CLINICAL DATA:  Dog bite EXAM: LEFT FEMUR 1 VIEW COMPARISON:  None. FINDINGS: There is no evidence of fracture or other focal bone lesions. Soft tissues are unremarkable. IMPRESSION: No fracture or dislocation of the left femur. Electronically Signed   By: Lauralyn Primes M.D.   On: 07/28/2020 17:32    Review of Systems  Musculoskeletal: Positive for arthralgias.  All other systems reviewed and are negative.   Blood pressure (!) 166/120, pulse (!) 114, temperature 97.9 F (36.6 C), temperature source Oral, resp. rate 20, SpO2 100 %. Physical Exam Vitals reviewed.  HENT:     Head: Normocephalic.     Nose: Nose normal.     Mouth/Throat:     Mouth: Mucous membranes are moist.  Eyes:     Pupils: Pupils are equal, round, and reactive to light.  Cardiovascular:     Rate and Rhythm: Normal rate.     Pulses: Normal pulses.  Pulmonary:     Effort: Pulmonary effort is normal.  Abdominal:     General: Abdomen is flat.  Musculoskeletal:     Cervical back: Normal range of motion.  Skin:    General: Skin is warm.     Capillary Refill: Capillary refill takes less than 2 seconds.  Neurological:     General: No focal deficit present.     Mental Status: She is alert.  Psychiatric:        Mood and Affect: Mood normal.   Right shoulder examination demonstrates fairly significant full-thickness skin and subcutaneous tissue fascial and partial muscle defect of the anterior part of the shoulder.  No  active bleeding.  Patient has intact EPL FPL interosseous wrist flexion wrist extension bicep tricep strength and deltoid is difficult to assess because of guarding.  Radial pulse is intact.  Left leg also has puncture wound in the mid thigh with a 3 cm surrounding area of devitalized appearing skin subcutaneous tissue and fascia.  Assessment/Plan Impression is dog bite right shoulder left leg.  Plan is excisional debridement with irrigation and closure likely over drains x2.  Risk benefits are discussed with the patient include not limited to infection nerve vessel damage persistent infection as well as potential need for more surgery as well as potential for fairly significant disfigurement in terms of the way the skin will heal.  Patient understands risk benefits and wishes to proceed.  All questions answered.  Anticipate keeping the patient here overnight for IV antibiotics.  Burnard Bunting, MD 07/28/2020, 8:23 PM

## 2020-07-28 NOTE — Anesthesia Procedure Notes (Signed)
Procedure Name: Intubation Date/Time: 07/28/2020 8:53 PM Performed by: Claudina Lick, CRNA Pre-anesthesia Checklist: Patient identified, Emergency Drugs available, Suction available, Patient being monitored and Timeout performed Patient Re-evaluated:Patient Re-evaluated prior to induction Oxygen Delivery Method: Circle system utilized Preoxygenation: Pre-oxygenation with 100% oxygen Induction Type: IV induction, Rapid sequence and Cricoid Pressure applied Laryngoscope Size: Miller and 2 Grade View: Grade I Tube type: Oral Tube size: 7.0 mm Number of attempts: 1 Airway Equipment and Method: Stylet Placement Confirmation: ETT inserted through vocal cords under direct vision,  positive ETCO2 and breath sounds checked- equal and bilateral Secured at: 21 cm Tube secured with: Tape Dental Injury: Teeth and Oropharynx as per pre-operative assessment

## 2020-07-28 NOTE — ED Provider Notes (Signed)
[facility] EMERGENCY DEPARTMENT Provider Note   CSN: [id number] Arrival date & time: 07/28/20  1529     History Chief Complaint  Patient presents with  . Animal Bite    Tina Walker is a 40 y.o. female without significant past medical history, presenting to the emergency department via EMS with dog bite.  She states she was entering a family member's home when there [place] mal[place] attacked her.  She has a large bite wound right shoulder region, as well as smaller bite wound to the left thigh.  She reports some numbness sensation in her right arm.  Denies history of immunocompromise.  Last Tdap is unknown.  She is given fentanyl in route.  The animal is up-to-date on all vaccinations including rabies.  The history is provided by the patient.       History reviewed. No pertinent past medical history.  There are no problems to display for this patient.   Past Surgical History:  Procedure Laterality Date  . CESAREAN SECTION       OB History   No obstetric history on file.     No family history on file.  Social History   Tobacco Use  . Smoking status: Never Smoker  Substance Use Topics  . Alcohol use: No  . Drug use: No    Home Medications Prior to Admission medications   Medication Sig Start Date End Date Taking? Authorizing Provider  amoxicillin (AMOXIL) 500 MG capsule Take 2 capsules (1,000 mg total) by mouth 2 (two) times daily. 05/21/15   Pisciotta, Joni Reining, PA-C  prochlorperazine (COMPAZINE) 10 MG tablet Take 1 tablet (10 mg total) by mouth every 6 (six) hours as needed (For headache). 05/21/15   Pisciotta, Joni Reining, PA-C    Allergies    Patient has no known allergies.  Review of Systems   Review of Systems  Skin: Positive for wound.  Allergic/Immunologic: Negative for immunocompromised state.  Neurological: Positive for numbness.  All other systems reviewed and are negative.   Physical Exam Updated Vital Signs BP (!) 166/120    Pulse (!) 114   Temp 97.9 F (36.6 C) (Oral)   Resp 20   SpO2 100%   Physical Exam Vitals and nursing note reviewed.  Constitutional:      Appearance: She is well-developed.     Comments: Patient is extremely anxious and hyperventilating.  HENT:     Head: Normocephalic and atraumatic.  Eyes:     Conjunctiva/sclera: Conjunctivae normal.  Cardiovascular:     Rate and Rhythm: Regular rhythm. Tachycardia present.  Pulmonary:     Effort: Pulmonary effort is normal.  Abdominal:     Palpations: Abdomen is soft.  Skin:    General: Skin is warm.     Comments: Large gaping wound to the right anterior shoulder. ~10cm by 5 cm  The wound does not appear to pass under the axilla though does go to the anterior axillary line.  The base of the wound appears to have subcutaneous tissue, no muscles are visualized however patient will not tolerate any touch whatsoever for examination.  With coaching she actively externally rotated the shoulder so that the innermost edge of the wound was visualized. She has some superficial lacerations above this large wound. Left anterior lower thigh with bite wounds as well.  Superficial abrasions to bilateral knees.  Neurological:     Mental Status: She is alert.     Comments: Patient reports decreased sensation to the right medial aspect of the  forearm and hand.  She has strong grip strength and strong radial pulse.  Psychiatric:        Behavior: Behavior normal.           ED Results / Procedures / Treatments   Labs (all labs ordered are listed, but only abnormal results are displayed) Labs Reviewed  CBC WITH DIFFERENTIAL/PLATELET - Abnormal; Notable for the following components:      Result Value   WBC 13.0 (*)    RDW 16.7 (*)    Lymphs Abs 4.2 (*)    Monocytes Absolute 1.3 (*)    All other components within normal limits  BASIC METABOLIC PANEL - Abnormal; Notable for the following components:   Potassium 3.2 (*)    Glucose, Bld 113 (*)    All  other components within normal limits  RESP PANEL BY RT-PCR (FLU A&B, COVID) [id number]  I-STAT BETA HCG BLOOD, ED ([facility], [facility], [facility] ONLY)    EKG None  Radiology DG Shoulder Right  Result Date: 07/28/2020 CLINICAL DATA:  Dog bite EXAM: RIGHT SHOULDER - 2+ VIEW COMPARISON:  None. FINDINGS: There is no evidence of fracture or dislocation. There is no evidence of arthropathy or other focal bone abnormality. Large soft tissue defect overlying the right upper arm. IMPRESSION: 1. No fracture or dislocation of the right shoulder. Joint spaces are well preserved. 2. Large soft tissue defect overlying the right upper arm. Electronically Signed   By: Lauralyn Primes M.D.   On: 07/28/2020 17:25   DG Femur 1V Left  Result Date: 07/28/2020 CLINICAL DATA:  Dog bite EXAM: LEFT FEMUR 1 VIEW COMPARISON:  None. FINDINGS: There is no evidence of fracture or other focal bone lesions. Soft tissues are unremarkable. IMPRESSION: No fracture or dislocation of the left femur. Electronically Signed   By: Lauralyn Primes M.D.   On: 07/28/2020 17:32    Procedures Procedures   Medications Ordered in ED Medications  fentaNYL (SUBLIMAZE) injection 50 mcg (50 mcg Intravenous Given 07/28/20 1619)  Tdap (BOOSTRIX) injection 0.5 mL (0.5 mLs Intramuscular Given 07/28/20 1619)  Ampicillin-Sulbactam (UNASYN) 3 g in sodium chloride 0.9 % 100 mL IVPB (0 g Intravenous Stopped 07/28/20 1807)  LORazepam (ATIVAN) injection 1 mg (1 mg Intravenous Given 07/28/20 1620)  fentaNYL (SUBLIMAZE) injection 50 mcg (50 mcg Intravenous Given 07/28/20 1907)    ED Course  I have reviewed the triage vital signs and the nursing notes.  Pertinent labs & imaging results that were available during my care of the patient were reviewed by me and considered in my medical decision making (see chart for details).  Clinical Course as of 07/28/20 1936  Sat Jul 28, 2020  3875 Patient now allowed me to apply saline gauze bandage.  Pain is returning though she is much  more calm.  Discussed Ortho plan per documentation for OR washout.  She is in agreement with this plan.  Has remained n.p.o., Covid swab sent [JR]    Clinical Course User Index [JR] Robinson, [place] N, PA-C   MDM Rules/Calculators/A&P                          Patient presenting with large, deep dog bite wound to right upper arm with reported numbness to the ulna aspect of the arm and hand.  Also has more superficial wound to the left thigh.  On initial evaluation she is very anxious and hyperventilating.  Does not allow me to examine the room other than  visual examination.  Wound appears to go to the subcutaneous tissue, no obvious visualized muscle at the base of the wound.  She does have decreased sensation along the ulnar aspect of the distal arm, vascularly intact.  X-rays are negative.  Tdap is updated.  Unasyn given.  Dog is up-to-date on rabies vaccinations.  Rabies prophylaxis deferred.  orthopedic consult considering extent to the wound on the right arm would benefit from OR washout.  Dr. August Saucer is in agreement and will take patient to the OR for further management.  Patient's anxiety much improved after Ativan.  Pain is managed well with fentanyl.  Patient is in agreement with plan.  Final Clinical Impression(s) / ED Diagnoses Final diagnoses:  Open wound of right upper arm due to dog bite  Dog bite of left thigh without complication, initial encounter    Rx / DC Orders ED Discharge Orders    None       Robinson, [place] N, PA-C 07/28/20 1937    Wynetta Fines, MD 07/29/20 1454

## 2020-07-28 NOTE — ED Notes (Signed)
Pt sister Karolee Stamps would like to be contact when pt is out of surgery [phone]

## 2020-07-28 NOTE — Progress Notes (Signed)
Chart photos reviewed Looks like something that would do better with operative washout Please make the patient n.p.o. and obtain Covid test immediately Plan to see her after I get out of this femoral fracture case

## 2020-07-28 NOTE — Progress Notes (Signed)
Attempted to contact Dr. Sampson Goon regarding patients blood pressure but was unable to reach him. Spoke to CRNA [employer] instead. Patient is resting quietly with no complaints of pain or s/s of distress. Her blood pressure is steadily increasing to presurgical levels. Patient does not have prior history of HTN documented. Calling to clarify that the MD was aware and was ok with sending patient to floor with bp in the 150/100's. Assured by CRNA Leary Roca that MD was aware and it was indeed appropriate to send patient to the floor. 07/28/2020 @ 2305 Manson Allan, RN

## 2020-07-28 NOTE — Brief Op Note (Signed)
   07/28/2020  10:04 PM  PATIENT:  Orrin Brigham  40 y.o. female  PRE-OPERATIVE DIAGNOSIS:  DOG BITES TO MULTIPLE EXTREMITIES  POST-OPERATIVE DIAGNOSIS: Dog bite to right shoulder and left thigh PROCEDURE:  Procedure(s): IRRIGATION AND EXCISIONAL DEBRIDEMENT RIGHT SHOULDER AND left thigh with closure of complex lacerations over drain  SURGEON:  Surgeon(s): August Saucer, Corrie Mckusick, MD  ASSISTANT: Rexene Edison, PA  ANESTHESIA:   general  EBL: 25 ml    Total I/O In: 1250 [I.V.:1000; IV Piggyback:250] Out: 50 [Blood:50]  BLOOD ADMINISTERED: none  DRAINS: Penrose drain in the Right shoulder and left leg   LOCAL MEDICATIONS USED: Marcaine morphine clonidine l  SPECIMEN:  No Specimen  COUNTS:  YES  TOURNIQUET:  * No tourniquets in log *  DICTATION: .Other Dictation: Dictation Number [id number]  PLAN OF CARE: Admit for overnight observation  PATIENT DISPOSITION:  PACU - hemodynamically stable

## 2020-07-28 NOTE — Transfer of Care (Signed)
Immediate Anesthesia Transfer of Care Note  Patient: Tina Walker  Procedure(s) Performed: IRRIGATION AND DEBRIDEMENT RIGHT ANTERIOR SHOULDER AND LEFT  ANTERIOR THIGH (Bilateral Shoulder)  Patient Location: PACU  Anesthesia Type:General  Level of Consciousness: drowsy  Airway & Oxygen Therapy: Patient Spontanous Breathing and Patient connected to face mask oxygen  Post-op Assessment: Report given to RN and Post -op Vital signs reviewed and stable  Post vital signs: Reviewed and stable  Last Vitals:  Vitals Value Taken Time  BP 126/83 07/28/20 2218  Temp    Pulse 93 07/28/20 2218  Resp 20 07/28/20 2218  SpO2 100 % 07/28/20 2218  Vitals shown include unvalidated device data.  Last Pain:  Vitals:   07/28/20 1536  TempSrc: Oral         Complications: No complications documented.

## 2020-07-28 NOTE — Anesthesia Preprocedure Evaluation (Addendum)
Anesthesia Evaluation  Patient identified by MRN, date of birth, ID band Patient awake    Reviewed: Allergy & Precautions, H&P , NPO status , Patient's Chart, lab work & pertinent test results  Airway Mallampati: III  TM Distance: >3 FB Neck ROM: Full    Dental no notable dental hx. (+) Teeth Intact, Dental Advisory Given   Pulmonary neg pulmonary ROS,    Pulmonary exam normal breath sounds clear to auscultation       Cardiovascular negative cardio ROS   Rhythm:Regular Rate:Normal     Neuro/Psych negative neurological ROS  negative psych ROS   GI/Hepatic negative GI ROS, Neg liver ROS,   Endo/Other  negative endocrine ROS  Renal/GU negative Renal ROS  negative genitourinary   Musculoskeletal   Abdominal   Peds  Hematology negative hematology ROS (+)   Anesthesia Other Findings   Reproductive/Obstetrics negative OB ROS                            Anesthesia Physical Anesthesia Plan  ASA: I and emergent  Anesthesia Plan: General   Post-op Pain Management:    Induction: Intravenous, Rapid sequence and Cricoid pressure planned  PONV Risk Score and Plan: 4 or greater and Ondansetron, Dexamethasone and Midazolam  Airway Management Planned: Oral ETT  Additional Equipment:   Intra-op Plan:   Post-operative Plan: Extubation in OR  Informed Consent: I have reviewed the patients History and Physical, chart, labs and discussed the procedure including the risks, benefits and alternatives for the proposed anesthesia with the patient or authorized representative who has indicated his/her understanding and acceptance.     Dental advisory given  Plan Discussed with: CRNA and Surgeon  Anesthesia Plan Comments:        Anesthesia Quick Evaluation

## 2020-07-28 NOTE — ED Triage Notes (Signed)
Pt BIB [facility] from home, pt attacked by brother's dog. Pt unsure of dog's vaccination status. Pt with puncture wounds to bilateral legs, large wound with adipose tissue exposed to right shoulder. Pt tearful, given fentanyl IN by EMS.

## 2020-07-29 ENCOUNTER — Encounter (HOSPITAL_COMMUNITY): Payer: Self-pay | Admitting: Orthopedic Surgery

## 2020-07-29 DIAGNOSIS — I10 Essential (primary) hypertension: Secondary | ICD-10-CM | POA: Diagnosis not present

## 2020-07-29 DIAGNOSIS — E876 Hypokalemia: Secondary | ICD-10-CM | POA: Diagnosis not present

## 2020-07-29 DIAGNOSIS — W540XXA Bitten by dog, initial encounter: Secondary | ICD-10-CM | POA: Diagnosis present

## 2020-07-29 MED ORDER — ASPIRIN EC 81 MG PO TBEC
81.0000 mg | DELAYED_RELEASE_TABLET | Freq: Every day | ORAL | Status: DC
  Administered 2020-07-29: 81 mg via ORAL
  Filled 2020-07-29: qty 1

## 2020-07-29 MED ORDER — METHOCARBAMOL 1000 MG/10ML IJ SOLN
500.0000 mg | Freq: Four times a day (QID) | INTRAVENOUS | Status: DC | PRN
  Filled 2020-07-29: qty 5

## 2020-07-29 MED ORDER — POTASSIUM CHLORIDE CRYS ER 20 MEQ PO TBCR
40.0000 meq | EXTENDED_RELEASE_TABLET | Freq: Once | ORAL | Status: AC
  Administered 2020-07-29: 40 meq via ORAL
  Filled 2020-07-29: qty 2

## 2020-07-29 MED ORDER — SODIUM CHLORIDE 0.9 % IV SOLN: INTRAVENOUS | Status: AC

## 2020-07-29 MED ORDER — CELECOXIB 200 MG PO CAPS
200.0000 mg | ORAL_CAPSULE | Freq: Two times a day (BID) | ORAL | Status: DC
  Administered 2020-07-29 (×2): 200 mg via ORAL
  Filled 2020-07-29 (×2): qty 1

## 2020-07-29 MED ORDER — DOCUSATE SODIUM 100 MG PO CAPS
100.0000 mg | ORAL_CAPSULE | Freq: Two times a day (BID) | ORAL | Status: DC
  Administered 2020-07-29: 100 mg via ORAL
  Filled 2020-07-29: qty 1

## 2020-07-29 MED ORDER — METHOCARBAMOL 500 MG PO TABS
500.0000 mg | ORAL_TABLET | Freq: Four times a day (QID) | ORAL | Status: DC | PRN
  Administered 2020-07-29: 500 mg via ORAL
  Filled 2020-07-29: qty 1

## 2020-07-29 MED ORDER — ONDANSETRON HCL 4 MG/2ML IJ SOLN: 4.0000 mg | Freq: Four times a day (QID) | INTRAMUSCULAR | Status: DC | PRN

## 2020-07-29 MED ORDER — MORPHINE SULFATE (PF) 2 MG/ML IV SOLN: 0.5000 mg | INTRAVENOUS | Status: DC | PRN

## 2020-07-29 MED ORDER — HYDROCHLOROTHIAZIDE 12.5 MG PO CAPS
12.5000 mg | ORAL_CAPSULE | Freq: Once | ORAL | Status: AC
  Administered 2020-07-29: 12.5 mg via ORAL
  Filled 2020-07-29: qty 1

## 2020-07-29 MED ORDER — METOCLOPRAMIDE HCL 5 MG PO TABS: 5.0000 mg | ORAL_TABLET | Freq: Three times a day (TID) | ORAL | Status: DC | PRN

## 2020-07-29 MED ORDER — HYDROCODONE-ACETAMINOPHEN 5-325 MG PO TABS: 1.0000 | ORAL_TABLET | ORAL | 0 refills | Status: DC | PRN

## 2020-07-29 MED ORDER — METHOCARBAMOL 500 MG PO TABS: 500.0000 mg | ORAL_TABLET | Freq: Four times a day (QID) | ORAL | 0 refills | Status: DC | PRN

## 2020-07-29 MED ORDER — ONDANSETRON HCL 4 MG PO TABS: 4.0000 mg | ORAL_TABLET | Freq: Four times a day (QID) | ORAL | Status: DC | PRN

## 2020-07-29 MED ORDER — LISINOPRIL-HYDROCHLOROTHIAZIDE 10-12.5 MG PO TABS: 1.0000 | ORAL_TABLET | Freq: Every day | ORAL | 1 refills | Status: DC

## 2020-07-29 MED ORDER — AMLODIPINE BESYLATE 5 MG PO TABS: 5.0000 mg | ORAL_TABLET | Freq: Every day | ORAL | 1 refills | Status: DC

## 2020-07-29 MED ORDER — LISINOPRIL 10 MG PO TABS: 10.0000 mg | ORAL_TABLET | Freq: Once | ORAL | Status: DC

## 2020-07-29 MED ORDER — SODIUM CHLORIDE 0.9 % IV SOLN
3.0000 g | Freq: Three times a day (TID) | INTRAVENOUS | Status: AC
  Administered 2020-07-29 (×2): 3 g via INTRAVENOUS
  Filled 2020-07-29: qty 3
  Filled 2020-07-29: qty 8

## 2020-07-29 MED ORDER — AMOXICILLIN-POT CLAVULANATE 875-125 MG PO TABS: 1.0000 | ORAL_TABLET | Freq: Two times a day (BID) | ORAL | 0 refills | Status: AC

## 2020-07-29 MED ORDER — METOCLOPRAMIDE HCL 5 MG/ML IJ SOLN: 5.0000 mg | Freq: Three times a day (TID) | INTRAMUSCULAR | Status: DC | PRN

## 2020-07-29 MED ORDER — HYDROCODONE-ACETAMINOPHEN 5-325 MG PO TABS
1.0000 | ORAL_TABLET | ORAL | Status: DC | PRN
  Administered 2020-07-29: 2 via ORAL
  Filled 2020-07-29: qty 2

## 2020-07-29 NOTE — Progress Notes (Signed)
  Subjective: Patient stable.  Pain reasonably well controlled.  Drains removed   Objective: Vital signs in last 24 hours: Temp:  [97.9 F (36.6 C)-98.8 F (37.1 C)] 98.8 F (37.1 C) (03/13 0700) Pulse Rate:  [84-128] 96 (03/13 0700) Resp:  [13-20] 19 (03/13 0700) BP: (126-191)/(83-130) 173/116 (03/13 0700) SpO2:  [98 %-100 %] 98 % (03/13 0700)  Intake/Output from previous day: 03/12 0701 - 03/13 0700 In: 1675 [P.O.:75; I.V.:1000; IV Piggyback:350] Out: 50 [Blood:50] Intake/Output this shift: No intake/output data recorded.  Exam:  Intact pulses distally Dorsiflexion/Plantar flexion intact  Labs: Recent Labs    07/28/20 1613  HGB 12.4   Recent Labs    07/28/20 1613  WBC 13.0*  RBC 4.70  HCT 37.7  PLT 280   Recent Labs    07/28/20 1613  NA 136  K 3.2*  CL 104  CO2 22  BUN 7  CREATININE 0.69  GLUCOSE 113*  CALCIUM 9.7   No results for input(s): LABPT, INR in the last 72 hours.  Assessment/Plan: Plan at this time is to obtain medicine consult for elevated blood pressure.  Could be pain related and event related.  Home with oral antibiotics and pain medicine and muscle relaxers.  Follow-up in the clinic on Thursday for dressing change and clinical recheck.   Tina Walker 07/29/2020, 12:26 PM

## 2020-07-29 NOTE — Evaluation (Signed)
Physical Therapy Evaluation Patient Details Name: Tina Walker MRN: [id number] DOB: Jul 17, 1980 Today's Date: 07/29/2020   History of Present Illness  Tina Walker is a 40 year old female with dog bite to multiple extremities.  Injured her right shoulder and left leg. Now s/p I&D R shoulder and L thigh with drains, which were removed on 3/13; Considerably high BP throughout stay (Simultaneous filing. User may not have seen previous data.)  Clinical Impression   Pt was admitted due to a dog bite of her right shoulder and left thigh with above. She works from home for [employer]. Lives with her 23 and 84 yr old children and mother will assist until she is better. Lives in a single level home with no steps to enter. Pt presents with considerable pain in R shoulder and L leg which is affecting her independence and safety with functional mobility/ADLs. Progressed from min assist with short step length and slow pace to not requiring physical assist with a step to gait pattern and increased gait speed. Pt performed AROM bilateral ankle DF, knee extension (quad set), and knee flexion. Chart showed hypertension, and post exercise elevation of BP 173/121 (L), pt not documented as having HTN or any history of elevated BP. HR 104 and O2 96% post exercise. Anticipate good progress and pt will be able to discharge home with family assist.      Follow Up Recommendations Home health PT;Supervision - Intermittent (Not sure that pt' sinsurance status will cover HH therapies -- worth also considering Outpt PT follow up, and The potential need for Outpatient PT can be addressed at Ortho follow-up appointments. )    Equipment Recommendations  None recommended by PT    Recommendations for Other Services OT consult (ordered per protocol)     Precautions / Restrictions Precautions Precautions: Other (comment) Precaution Comments: Lots of pain with initial movement (Simultaneous filing. User may not have seen previous  data.) Required Braces or Orthoses: Sling (for comfort) Restrictions Weight Bearing Restrictions: No (Simultaneous filing. User may not have seen previous data.)      Mobility  Bed Mobility               General bed mobility comments: In chair upon arrival (Simultaneous filing. User may not have seen previous data.)    Transfers Overall transfer level: Needs assistance (Simultaneous filing. User may not have seen previous data.) Equipment used: None (Simultaneous filing. User may not have seen previous data.) Transfers: Sit to/from Stand (Simultaneous filing. User may not have seen previous data.) Sit to Stand: Min guard (Simultaneous filing. User may not have seen previous data.)         General transfer comment: Minguard assist for safety; good push up from armrest with LUE; Stood with more weight on RLE compared to Left  Ambulation/Gait Ambulation/Gait assistance: Min guard Gait Distance (Feet): 100 Feet Assistive device: Straight cane;None Gait Pattern/deviations: Decreased step length - right;Decreased step length - left;Decreased stance time - left Gait velocity: quite slow   General Gait Details: Cues throughout walk to relax shoulders; Iinitially tried straight cane which seemed to be more in the way than helpful; switched to LUE support on counter in room, and progressed to where she did not overtly need UE support; Gait did smooth out mroe with incr distance, and pt seemed more confident in her ability to walk as well  Stairs            Wheelchair Mobility    Modified Rankin (Stroke Patients Only)  Balance Overall balance assessment: Mild deficits observed, not formally tested (Simultaneous filing. User may not have seen previous data.)                                           Pertinent Vitals/Pain Pain Assessment: 0-10 (Simultaneous filing. User may not have seen previous data.) Pain Score: 9  Faces Pain Scale: Hurts  whole lot Pain Location: R shoulder and L thigh initially; after walking, pt reports pain is a 3/10 (Simultaneous filing. User may not have seen previous data.) Pain Descriptors / Indicators: Aching;Grimacing;Guarding (Simultaneous filing. User may not have seen previous data.) Pain Intervention(s): Monitored during session;Repositioned;Other (comment) (Pt di dnot want pain meds when RN offered  Simultaneous filing. User may not have seen previous data.)    Home Living Family/patient expects to be discharged to:: Private residence (Simultaneous filing. User may not have seen previous data.) Living Arrangements: Children;Parent;Other (Comment) (2 kids, ages 3 and 23; pt's mother plans to come stay with her  Simultaneous filing. User may not have seen previous data.) Available Help at Discharge: Family;Available 24 hours/day (at or near 24 hours  Simultaneous filing. User may not have seen previous data.) Type of Home: House (Simultaneous filing. User may not have seen previous data.) Home Access: Level entry (Simultaneous filing. User may not have seen previous data.)     Home Layout: One level lHome Equipment: None  Additional Comments: Reports she can use counters in the bathroom for UE support if needed    Prior Function Level of Independence: Independent         Comments: Works from home      Hand Dominance   Dominant Hand: Right     Extremity/Trunk Assessment   Upper Extremity Assessment Upper Extremity Assessment: Defer to OT evaluation (bulky dressing at shoulder  Simultaneous filing. User may not have seen previous data.) RUE Deficits / Details: limited AROM to R shoulder due to pain and dressing/drain, approx 30 degrees of flexion. elbow, wrist, hand ROM intact RUE: Unable to fully assess due to pain    Lower Extremity Assessment Lower Extremity Assessment: LLE deficits/detail LLE Deficits / Details: Bulky dressing with drain at L thigh; Painful with initial attempts  at ROM; quad activation present, but weak; After bout with amb, much improved ROM; able to flex knee to approx 80 degrees with gravity assist       Communication   Communication: No difficulties (Simultaneous filing. User may not have seen previous data.)  Cognition Arousal/Alertness: Awake/alert (Simultaneous filing. User may not have seen previous data.) Behavior During Therapy: [facility] for tasks assessed/performed (Simultaneous filing. User may not have seen previous data.) Overall Cognitive Status: Within Functional Limits for tasks assessed (Simultaneous filing. User may not have seen previous data.)                                 General Comments: a bit anxious with anticipation of pain      General Comments General comments (skin integrity, edema, etc.): Discussed consideratins for dc home, as well as gently working on ROM for optimal healing (knee and shoulder); Pt was so hesitant to move RUE (including for changing gown) -- we discussed OT consult, and placed OT consult per protocol; O2 sats greater than or equal to 96% throughout out session on Room Air;  BP high post amb: 137/121, RN notified    Exercises Other Exercises    Assessment/Plan    PT Assessment Patient needs continued PT services  PT Problem List Decreased strength;Decreased range of motion;Decreased activity tolerance;Decreased mobility;Decreased knowledge of use of DME;Decreased safety awareness;Decreased knowledge of precautions;Pain       PT Treatment Interventions DME instruction;Gait training;Stair training;Functional mobility training;Therapeutic activities;Therapeutic exercise;Balance training;Neuromuscular re-education;Cognitive remediation;Patient/family education    PT Goals (Current goals can be found in the Care Plan section)  Acute Rehab PT Goals Patient Stated Goal: home PT Goal Formulation: With patient Time For Goal Achievement: 08/12/20 Potential to Achieve Goals: Good     Frequency Min 5X/week   Barriers to discharge        Co-evaluation               AM-PAC PT "6 Clicks" Mobility  Outcome Measure Help needed turning from your back to your side while in a flat bed without using bedrails?: A Lot Help needed moving from lying on your back to sitting on the side of a flat bed without using bedrails?: A Little Help needed moving to and from a bed to a chair (including a wheelchair)?: A Little Help needed standing up from a chair using your arms (e.g., wheelchair or bedside chair)?: A Little Help needed to walk in hospital room?: None Help needed climbing 3-5 steps with a railing? : A Little 6 Click Score: 18    End of Session Equipment Utilized During Treatment: Gait belt Activity Tolerance: Patient tolerated treatment well (the act of getting up and moving seemed to help with her pain) Patient left: in chair;with call bell/phone within reach Nurse Communication: Mobility status PT Visit Diagnosis: Other abnormalities of gait and mobility (R26.89);Pain Pain - Right/Left: Right Pain - part of body: Shoulder (and L thigh)    Time: 1020-1104 PT Time Calculation (min) (ACUTE ONLY): 44 min   Charges:   PT Evaluation $PT Eval Moderate Complexity: 1 Mod PT Treatments $Gait Training: 8-22 mins $Therapeutic Activity: 8-22 mins        Van Clines, PT  Acute Rehabilitation Services Pager [phone] Office [phone]   Levi Aland 07/29/2020, 1:38 PM

## 2020-07-29 NOTE — Evaluation (Signed)
Occupational Therapy Evaluation Patient Details Name: Tina Walker MRN: [id number] DOB: 08/11/1980 Today's Date: 07/29/2020    History of Present Illness 40 y.o. female without significant past medical history, presenting to the emergency department via EMS with dog bite. Patient s/p I&D to R shoulder and L LE on 3/12   Clinical Impression   Patient lives with her 2 children in single level house with no steps to enter. At baseline patient is fully I, works from home. Patient states her mother is going to come stay with her after D/C to assist as needed. Currently patient needing increased assistance with ADLs due to R UE and L LE pain, limited ROM impacting her overall activity tolerance, balance and safety. Instructed patient in gentle ROM exercises for R UE as listed below as well as compensatory strategies for dressing, bathing. Provided patient with ADL kit and gave demonstration of all equipment, pt verbalize understanding. Recommend continued acute OT services to maximize patient safety and independence with self care in order to facilitate D/C to venue listed below.    Follow Up Recommendations  Supervision/Assistance - 24 hour    Equipment Recommendations  Other (comment) (provided patient with ADL kit)       Precautions / Restrictions Precautions Precaution Comments: drain in R shoulder and L thigh Required Braces or Orthoses: Sling (for comfort) Restrictions Weight Bearing Restrictions: No      Mobility Bed Mobility               General bed mobility comments: in chair    Transfers Overall transfer level: Needs assistance Equipment used: None Transfers: Sit to/from Stand Sit to Stand: Supervision         General transfer comment: no physical assistance, needs increased time due to pain    Balance Overall balance assessment: Mild deficits observed, not formally tested                                         ADL either performed or  assessed with clinical judgement   ADL Overall ADL's : Needs assistance/impaired     Grooming: Set up;Sitting   Upper Body Bathing: Minimal assistance;Sitting   Lower Body Bathing: Moderate assistance;Sitting/lateral leans;Sit to/from stand   Upper Body Dressing : Moderate assistance;Sitting Upper Body Dressing Details (indicate cue type and reason): educate patient in compensatory strategies dressing R UE first and to wear loose baggy clothing to maximize ease of dressing at home Lower Body Dressing: Maximal assistance;Sitting/lateral leans;Sit to/from stand Lower Body Dressing Details (indicate cue type and reason): due to pain, instruct patient to don L LE first with LB dressing. provided visual demo of reacher, sock aid, shoe horn and LH sponge in ADL kit to patient and her children. verbalized understanding Toilet Transfer: Copy Details (indicate cue type and reason): increased time but no physical assistance to power up to standing or ambulate in room ~10 ft Toileting- Clothing Manipulation and Hygiene: Minimal assistance;Sitting/lateral lean;Sit to/from stand       Functional mobility during ADLs: Supervision/safety General ADL Comments: patient requiring increased assistance with self care due to pain limiting AROM, strength, activity tolerance and safety                  Pertinent Vitals/Pain Pain Assessment: Faces Faces Pain Scale: Hurts whole lot Pain Location: R shoulder, L LE Pain Descriptors / Indicators: Burning;Crying;Tightness Pain Intervention(s): Monitored during  session     Hand Dominance Right   Extremity/Trunk Assessment Upper Extremity Assessment Upper Extremity Assessment: RUE deficits/detail RUE Deficits / Details: limited AROM to R shoulder due to pain and dressing/drain, approx 30 degrees of flexion. elbow, wrist, hand ROM intact RUE: Unable to fully assess due to pain   Lower Extremity Assessment Lower Extremity  Assessment: Defer to PT evaluation       Communication Communication Communication: No difficulties   Cognition Arousal/Alertness: Awake/alert Behavior During Therapy: WFL for tasks assessed/performed Overall Cognitive Status: Within Functional Limits for tasks assessed                                        Exercises Exercises: Other exercises Other Exercises Other Exercises: instruct patient in gentle ROM exercises for R UE including AROM of elbow, wrist, hand as tolerated as well as gentle towel slides for R shoulder. Instruct patient to let pain be her guide as exercise is meant to be gentle to encourage ROM to prevent stiffness in R shoulder. verbalize understanding   Shoulder Instructions      Home Living Family/patient expects to be discharged to:: Private residence Living Arrangements: Children;Other (Comment) (mother coming to stay with patient) Available Help at Discharge: Family;Available 24 hours/day Type of Home: House Home Access: Level entry     Home Layout: One level     Bathroom Shower/Tub: Occupational psychologist: Standard     Home Equipment: None          Prior Functioning/Environment Level of Independence: Independent        Comments: works from home for [employer]        OT Problem List: Decreased range of motion;Decreased activity tolerance;Impaired balance (sitting and/or standing);Decreased safety awareness;Decreased knowledge of use of DME or AE;Impaired UE functional use;Pain      OT Treatment/Interventions: Self-care/ADL training;Therapeutic exercise;DME and/or AE instruction;Therapeutic activities;Patient/family education;Balance training    OT Goals(Current goals can be found in the care plan section) Acute Rehab OT Goals Patient Stated Goal: home OT Goal Formulation: With patient Time For Goal Achievement: 08/12/20 Potential to Achieve Goals: Good  OT Frequency: Min 3X/week    AM-PAC OT "6  Clicks" Daily Activity     Outcome Measure Help from another person eating meals?: None Help from another person taking care of personal grooming?: A Little Help from another person toileting, which includes using toliet, bedpan, or urinal?: A Little Help from another person bathing (including washing, rinsing, drying)?: A Lot Help from another person to put on and taking off regular upper body clothing?: A Lot Help from another person to put on and taking off regular lower body clothing?: A Lot 6 Click Score: 16   End of Session Nurse Communication: Mobility status  Activity Tolerance: Patient tolerated treatment well;Patient limited by pain Patient left: in chair;with call bell/phone within reach;with family/visitor present  OT Visit Diagnosis: Other abnormalities of gait and mobility (R26.89);Pain Pain - Right/Left: Right Pain - part of body: Shoulder (L L E)                Time: 8242-3536 OT Time Calculation (min): 23 min Charges:  OT General Charges $OT Visit: 1 Visit OT Evaluation $OT Eval Low Complexity: 1 Low OT Treatments $Self Care/Home Management : 8-22 mins  Delbert Phenix OT OT pager: [phone]  Rosemary Holms 07/29/2020, 1:04 PM

## 2020-07-29 NOTE — Consult Note (Signed)
[facility] Medical Consultation  Sheila Ocasio ZOX:[id number] DOB: 05-Mar-1981 DOA: 07/28/2020 PCP: Patient, No Pcp Per   Requesting physician: Dr. August Saucer Date of consultation: HTN Reason for consultation: HTN  Impression/Recommendations Active Problems:   Status post surgery   Dog bite    1. HTN. Although her BP elevation was probably somewhat related to the dog bite and the I&D surgery, I does seems she has a background HTN chronic. Start her on Amlodipine regimen, recommend recheck BP in 1-3 days and follow up with PCP in 1 week. Pt expressed understanding and agreed. 2. Hypokalemia, PO potassium, and one dose of Lisinopril for now, pregnancy test negative. Not starting ACEI for unknown family plan. 3. Dog bite wound, s/p I&D, ABX as per primary team.  I will followup again tomorrow. Please contact me if I can be of assistance in the meanwhile. Thank you for this consultation.  Chief Complaint: Feeling ok  HPI:  40 y/o no significant PMH, came with right shoulder dog bite wound and secondary infection ans abscess and underwent I&D, drainage. Post-op surgical pain fairly controlled. But noticed that her BP has been significantly elevated, her pregnancy test is negative.  Review of Systems:  14 points of review system done negative, except for those mentioned in HPI.  History reviewed. No pertinent past medical history. Past Surgical History:  Procedure Laterality Date  . CESAREAN SECTION    . I & D EXTREMITY Bilateral 07/28/2020   Procedure: IRRIGATION AND DEBRIDEMENT RIGHT ANTERIOR SHOULDER AND LEFT  ANTERIOR THIGH;  Surgeon: Cammy Copa, MD;  Location: [facility] OR;  Service: Orthopedics;  Laterality: Bilateral;   Social History:  reports that she has never smoked. She does not have any smokeless tobacco history on file. She reports that she does not drink alcohol and does not use drugs.  No Known Allergies No family history on file.  Prior to Admission medications    Medication Sig Start Date End Date Taking? Authorizing Provider  amoxicillin-clavulanate (AUGMENTIN) 875-125 MG tablet Take 1 tablet by mouth 2 (two) times daily for 14 days. 07/29/20 08/12/20 Yes Cammy Copa, MD  lisinopril-hydrochlorothiazide (ZESTORETIC) 10-12.5 MG tablet Take 1 tablet by mouth daily. 07/29/20 07/29/21 Yes Zhang, Renae Fickle, MD  amoxicillin (AMOXIL) 500 MG capsule Take 2 capsules (1,000 mg total) by mouth 2 (two) times daily. 05/21/15   Pisciotta, Joni Reining, PA-C  HYDROcodone-acetaminophen (NORCO/VICODIN) 5-325 MG tablet Take 1-2 tablets by mouth every 4 (four) hours as needed for moderate pain (pain score 4-6). 07/29/20   Cammy Copa, MD  methocarbamol (ROBAXIN) 500 MG tablet Take 1 tablet (500 mg total) by mouth every 6 (six) hours as needed for muscle spasms. 07/29/20   Cammy Copa, MD  prochlorperazine (COMPAZINE) 10 MG tablet Take 1 tablet (10 mg total) by mouth every 6 (six) hours as needed (For headache). 05/21/15   Pisciotta, Joni Reining, PA-C   Physical Exam: Blood pressure (!) 173/116, pulse 96, temperature 98.8 F (37.1 C), temperature source Oral, resp. rate 19, last menstrual period 07/21/2020, SpO2 98 %. Vitals:   07/29/20 0300 07/29/20 0700  BP: (!) 159/105 (!) 173/116  Pulse: 97 96  Resp: 19 19  Temp: 98.3 F (36.8 C) 98.8 F (37.1 C)  SpO2: 99% 98%     General:  No acute distress  Eyes: PERRL  ENT: No rash  Neck: Supple  Cardiovascular: RRR  Respiratory: Clear breathing  Abdomen: Soft, non-tender  Skin: No rash  Musculoskeletal: Right shoulder in surgical dressing  Psychiatric:  Calm  Neurologic: Intact  Labs on Admission:  Basic Metabolic Panel: Recent Labs  Lab 07/28/20 1613  NA 136  K 3.2*  CL 104  CO2 22  GLUCOSE 113*  BUN 7  CREATININE 0.69  CALCIUM 9.7   Liver Function Tests: No results for input(s): AST, ALT, ALKPHOS, BILITOT, PROT, ALBUMIN in the last 168 hours. No results for input(s): LIPASE, AMYLASE in the  last 168 hours. No results for input(s): AMMONIA in the last 168 hours. CBC: Recent Labs  Lab 07/28/20 1613  WBC 13.0*  NEUTROABS 7.3  HGB 12.4  HCT 37.7  MCV 80.2  PLT 280   Cardiac Enzymes: No results for input(s): CKTOTAL, CKMB, CKMBINDEX, TROPONINI in the last 168 hours. BNP: Invalid input(s): POCBNP CBG: No results for input(s): GLUCAP in the last 168 hours.  Radiological Exams on Admission: DG Shoulder Right  Result Date: 07/28/2020 CLINICAL DATA:  Dog bite EXAM: RIGHT SHOULDER - 2+ VIEW COMPARISON:  None. FINDINGS: There is no evidence of fracture or dislocation. There is no evidence of arthropathy or other focal bone abnormality. Large soft tissue defect overlying the right upper arm. IMPRESSION: 1. No fracture or dislocation of the right shoulder. Joint spaces are well preserved. 2. Large soft tissue defect overlying the right upper arm. Electronically Signed   By: Lauralyn Primes M.D.   On: 07/28/2020 17:25   DG Femur 1V Left  Result Date: 07/28/2020 CLINICAL DATA:  Dog bite EXAM: LEFT FEMUR 1 VIEW COMPARISON:  None. FINDINGS: There is no evidence of fracture or other focal bone lesions. Soft tissues are unremarkable. IMPRESSION: No fracture or dislocation of the left femur. Electronically Signed   By: Lauralyn Primes M.D.   On: 07/28/2020 17:32    EKG: None  Time spent: 25  Emeline General [facility] Pager [phone] 07/29/2020, 12:55 PM

## 2020-07-30 ENCOUNTER — Other Ambulatory Visit (INDEPENDENT_AMBULATORY_CARE_PROVIDER_SITE_OTHER): Payer: Self-pay | Admitting: Orthopedic Surgery

## 2020-07-30 ENCOUNTER — Other Ambulatory Visit: Payer: Self-pay | Admitting: Surgical

## 2020-07-30 ENCOUNTER — Telehealth: Payer: Self-pay | Admitting: Orthopedic Surgery

## 2020-07-30 MED ORDER — HYDROCODONE-ACETAMINOPHEN 5-325 MG PO TABS
1.0000 | ORAL_TABLET | ORAL | 0 refills | Status: DC | PRN
Start: 2020-07-30 — End: 2021-03-08

## 2020-07-30 MED ORDER — HYDROCODONE-ACETAMINOPHEN 5-325 MG PO TABS
1.0000 | ORAL_TABLET | ORAL | 0 refills | Status: DC | PRN
Start: 2020-07-30 — End: 2020-07-30

## 2020-07-30 NOTE — Telephone Encounter (Signed)
Please advise 

## 2020-07-30 NOTE — Op Note (Signed)
NAME: Tina Walker, Tina Walker MEDICAL RECORD NO: [id number] ACCOUNT NO: [id number] DATE OF BIRTH: 1980/11/14 FACILITY: [facility] LOCATION: [facility] PHYSICIAN: Graylin Shiver. August Saucer, MD  Operative Report   DATE OF PROCEDURE: 07/28/2020  PREOPERATIVE DIAGNOSES:  Right shoulder dog bite, left leg dog bite.  POSTOPERATIVE DIAGNOSES:  Right shoulder dog bite, left leg dog bite.  PROCEDURE:  Irrigation and excisional debridement of right shoulder complex wound and left leg complex wounds x4.  SURGEON:  Corrie Mckusick Tirsa Gail, MD  ASSISTANT:  Rexene Edison.  INDICATIONS:  The patient is a 40 year old female who was attacked by a dog who bit her right shoulder and left leg.  She presents now for operative management after explanation of risks and benefits.  DESCRIPTION OF PROCEDURE:  The patient was brought to the operating room where general anesthetic was induced.  Perioperative IV antibiotics were maintained.  Left leg was prescrubbed with Hibiclens and saline, draped in a sterile manner, sealed with  Ioban.  Right shoulder was also prepped with Hibiclens and saline, draped in a sterile manner.  Timeout was called.  Left leg was first addressed.  Complex laceration to the thigh measuring about 4 cm was present.  Excisional debridement using a scalpel  was performed involving skin, subcutaneous tissue, fascia, and muscle.  This area was debrided back to normal-appearing tissue.  What was left was approximately 4 cm x 2 cm.  Thorough irrigation was then performed.  Excisional debridement was performed  primarily using knife, forceps, and scissors.  There was no puncture wound below the fascia.  Thorough irrigation with 2 liters of irrigating solution was performed, and this laceration was closed loosely using one 2-0 Vicryl suture and four 3-0 nylon  sutures over a Penrose drain.  There were also 3 other lacerations measuring about 1.5 cm, which were debrided including the skin and subcutaneous tissue.  These were also  irrigated, and each closed using one 3-0 nylon suture.  Each one measured 1.5 cm,  and there were three total including the larger 4 cm incision on the thigh.  Next, the shoulder was addressed.  This was a much larger incision.  It measured approximately 12 x 8 cm.  There were devitalized skin flaps present.  This area was also  debrided using a knife to remove skin edges as well as affected subcutaneous tissue and fascia.  Muscle was also partially affected.  Curette was used to further debride this bed of tissue.  Then, thorough irrigation with 3 liters of irrigating solution  was utilized.  Penrose drain was placed, and then closure was achieved using three 2-0 Vicryl sutures and then two far-near-near-far 2-0 nylon sutures and then widely spaced 3-0 nylon sutures to reapproximate the skin.  Laceration was complex and  stellate in parts.  It extended down into the axillary region.  A Hemovac drain was placed and then Xeroform was placed over that followed by bulky dressings sealed with Tegaderm.  Left leg was covered with Xeroform, bulky dressings, and Ace wrap.   Shoulder sling was applied.  The patient tolerated the procedure well without immediate complications.  Gil's assistance was required for retraction, suturing, mobilization of tissue, drain placement.  His assistance was a medical necessity.  It should be noted that the lacerations were also numbed up using Marcaine, morphine, clonidine solution.  Total length of that incisional closure was approximately 15 cm x 8 cm.  PLAN:  Admit for IV antibiotics overnight followed by mobilization and discharge home on oral antibiotics tomorrow.  ROH D: 07/28/2020 10:11:51 pm T: 07/29/2020 12:56:00 pm  JOB: [id number]/ [id number]

## 2020-07-30 NOTE — Telephone Encounter (Signed)
Patient called advised the pharmacy lost the Rx for Hydrocodone. Patient asked if the Rx can be sent again.  [facility] on [address].  The number to contact patient is [phone]

## 2020-07-30 NOTE — Telephone Encounter (Signed)
Sent in

## 2020-08-01 NOTE — Discharge Summary (Signed)
Physician Discharge Summary      Patient ID: Tina Walker MRN: [id number] DOB/AGE: April 06, 1981 40 y.o.  Admit date: 07/28/2020 Discharge date: 07/29/2020  Admission Diagnoses:  Active Problems:   Status post surgery   Dog bite   Primary hypertension   Discharge Diagnoses:  Same  Surgeries: Procedure(s): IRRIGATION AND DEBRIDEMENT RIGHT ANTERIOR SHOULDER AND LEFT  ANTERIOR THIGH on 07/28/2020   Consultants:   Discharged Condition: Stable  Hospital Course: Tina Walker is an 40 y.o. female who was admitted 07/28/2020 with a chief complaint of  Chief Complaint  Patient presents with  . Animal Bite  , and found to have a diagnosis of dog bite.  They were brought to the operating room on 07/28/2020 and underwent the above named procedures.  Drains were removed postop day #1.  Patient tolerated the procedure well.  Maintained on IV antibiotics during hospitalization and discharged home in good condition on oral pain medicine as well as oral antibiotics.  She was seen by the medical service for initial consultation and treatment of hypertension as well during this admission. Antibiotics given:  Anti-infectives (From admission, onward)   Start     Dose/Rate Route Frequency Ordered Stop   07/29/20 0100  Ampicillin-Sulbactam (UNASYN) 3 g in sodium chloride 0.9 % 100 mL IVPB        3 g 200 mL/hr over 30 Minutes Intravenous Every 8 hours 07/29/20 0011 07/29/20 0844   07/29/20 0000  amoxicillin-clavulanate (AUGMENTIN) 875-125 MG tablet        1 tablet Oral 2 times daily 07/29/20 1232 08/12/20 2359   07/28/20 1600  Ampicillin-Sulbactam (UNASYN) 3 g in sodium chloride 0.9 % 100 mL IVPB        3 g 200 mL/hr over 30 Minutes Intravenous  Once 07/28/20 1557 07/28/20 1807    .  Recent vital signs:  Vitals:   07/29/20 0300 07/29/20 0700  BP: (!) 159/105 (!) 173/116  Pulse: 97 96  Resp: 19 19  Temp: 98.3 F (36.8 C) 98.8 F (37.1 C)  SpO2: 99% 98%    Recent laboratory studies:   Results for orders placed or performed during the hospital encounter of 07/28/20  Resp Panel by RT-PCR (Flu A&B, Covid) Nasopharyngeal Swab   Specimen: Nasopharyngeal Swab; Nasopharyngeal(NP) swabs in vial transport medium  Result Value Ref Range   SARS Coronavirus 2 by RT PCR NEGATIVE NEGATIVE   Influenza A by PCR NEGATIVE NEGATIVE   Influenza B by PCR NEGATIVE NEGATIVE  CBC with Differential  Result Value Ref Range   WBC 13.0 (H) 4.0 - 10.5 K/uL   RBC 4.70 3.87 - 5.11 MIL/uL   Hemoglobin 12.4 12.0 - 15.0 g/dL   HCT 17.4 08.1 - 44.8 %   MCV 80.2 80.0 - 100.0 fL   MCH 26.4 26.0 - 34.0 pg   MCHC 32.9 30.0 - 36.0 g/dL   RDW 18.5 (H) 63.1 - 49.7 %   Platelets 280 150 - 400 K/uL   nRBC 0.0 0.0 - 0.2 %   Neutrophils Relative % 55 %   Neutro Abs 7.3 1.7 - 7.7 K/uL   Lymphocytes Relative 32 %   Lymphs Abs 4.2 (H) 0.7 - 4.0 K/uL   Monocytes Relative 10 %   Monocytes Absolute 1.3 (H) 0.1 - 1.0 K/uL   Eosinophils Relative 2 %   Eosinophils Absolute 0.2 0.0 - 0.5 K/uL   Basophils Relative 1 %   Basophils Absolute 0.1 0.0 - 0.1 K/uL   Immature Granulocytes 0 %  Abs Immature Granulocytes 0.04 0.00 - 0.07 K/uL  Basic metabolic panel  Result Value Ref Range   Sodium 136 135 - 145 mmol/L   Potassium 3.2 (L) 3.5 - 5.1 mmol/L   Chloride 104 98 - 111 mmol/L   CO2 22 22 - 32 mmol/L   Glucose, Bld 113 (H) 70 - 99 mg/dL   BUN 7 6 - 20 mg/dL   Creatinine, Ser 2.99 0.44 - 1.00 mg/dL   Calcium 9.7 8.9 - 37.1 mg/dL   GFR, Estimated >69 >67 mL/min   Anion gap 10 5 - 15  I-Stat beta hCG blood, ED  Result Value Ref Range   I-stat hCG, quantitative <5.0 <5 mIU/mL   Comment 3            Discharge Medications:   Allergies as of 07/29/2020   No Known Allergies     Medication List    STOP taking these medications   amoxicillin 500 MG capsule Commonly known as: AMOXIL   prochlorperazine 10 MG tablet Commonly known as: COMPAZINE     TAKE these medications   amLODipine 5 MG  tablet Commonly known as: NORVASC Take 1 tablet (5 mg total) by mouth daily.   amoxicillin-clavulanate 875-125 MG tablet Commonly known as: Augmentin Take 1 tablet by mouth 2 (two) times daily for 14 days.   methocarbamol 500 MG tablet Commonly known as: ROBAXIN Take 1 tablet (500 mg total) by mouth every 6 (six) hours as needed for muscle spasms.       Diagnostic Studies: DG Shoulder Right  Result Date: 07/28/2020 CLINICAL DATA:  Dog bite EXAM: RIGHT SHOULDER - 2+ VIEW COMPARISON:  None. FINDINGS: There is no evidence of fracture or dislocation. There is no evidence of arthropathy or other focal bone abnormality. Large soft tissue defect overlying the right upper arm. IMPRESSION: 1. No fracture or dislocation of the right shoulder. Joint spaces are well preserved. 2. Large soft tissue defect overlying the right upper arm. Electronically Signed   By: Lauralyn Primes M.D.   On: 07/28/2020 17:25   DG Femur 1V Left  Result Date: 07/28/2020 CLINICAL DATA:  Dog bite EXAM: LEFT FEMUR 1 VIEW COMPARISON:  None. FINDINGS: There is no evidence of fracture or other focal bone lesions. Soft tissues are unremarkable. IMPRESSION: No fracture or dislocation of the left femur. Electronically Signed   By: Lauralyn Primes M.D.   On: 07/28/2020 17:32    Disposition: Discharge disposition: 01-Home or Self Care       Discharge Instructions    Call MD / Call 911   Complete by: As directed    If you experience chest pain or shortness of breath, CALL 911 and be transported to the hospital emergency room.  If you develope a fever above 101 F, pus (white drainage) or increased drainage or redness at the wound, or calf pain, call your surgeon's office.   Constipation Prevention   Complete by: As directed    Drink plenty of fluids.  Prune juice may be helpful.  You may use a stool softener, such as Colace (over the counter) 100 mg twice a day.  Use MiraLax (over the counter) for constipation as needed.   Diet -  low sodium heart healthy   Complete by: As directed    Discharge instructions   Complete by: As directed    Do not get dressings wet Okay for shoulder range of motion on the right and knee range of motion as tolerated Follow-up in clinic  on Thursday for recheck   Increase activity slowly as tolerated   Complete by: As directed          Signed: Burnard Bunting 08/01/2020, 7:00 AM

## 2020-08-02 ENCOUNTER — Encounter (HOSPITAL_COMMUNITY): Payer: Self-pay

## 2020-08-02 ENCOUNTER — Emergency Department (HOSPITAL_COMMUNITY)
Admission: EM | Admit: 2020-08-02 | Discharge: 2020-08-02 | Disposition: A | Discharge status: Home / Self Care | Payer: Self-pay | Attending: Emergency Medicine | Admitting: Emergency Medicine

## 2020-08-02 DIAGNOSIS — S71152D Open bite, left thigh, subsequent encounter: Secondary | ICD-10-CM | POA: Insufficient documentation

## 2020-08-02 DIAGNOSIS — W540XXD Bitten by dog, subsequent encounter: Secondary | ICD-10-CM | POA: Insufficient documentation

## 2020-08-02 DIAGNOSIS — Z4801 Encounter for change or removal of surgical wound dressing: Secondary | ICD-10-CM | POA: Insufficient documentation

## 2020-08-02 DIAGNOSIS — Z5189 Encounter for other specified aftercare: Secondary | ICD-10-CM

## 2020-08-02 DIAGNOSIS — S41051D Open bite of right shoulder, subsequent encounter: Secondary | ICD-10-CM | POA: Insufficient documentation

## 2020-08-02 NOTE — Discharge Instructions (Signed)
Your wounds are healing appropriately.  There are still sutures in place.  Please call and follow up with your orthopedist outpatient in the next few days for recheck and for sutures removal.

## 2020-08-02 NOTE — ED Triage Notes (Signed)
Pt states that she is here for wound check. Pt reports that she had surgery and they told her to come here to have recheck on stitches.

## 2020-08-02 NOTE — ED Provider Notes (Signed)
[facility] EMERGENCY DEPARTMENT Provider Note   CSN: [id number] Arrival date & time: 08/02/20  1052     History Chief Complaint  Patient presents with  . Wound Check    Tina Walker is a 40 y.o. female.  The history is provided by the patient and medical records. No language interpreter was used.  Wound Check     40 year old female who suffered dog bites to her right shoulder and left thigh who underwent I&D by Dr. August Saucer on 3/13 is presenting today for evaluation of wound check.  Patient report she is here at the recommended daily for wound recheck per her discharge paperwork.  She mention no significant pain no fever or redness to the affected area.  She endorsed itchiness to the affected area only.  She has been compliant with medication.  No other complaint.  History reviewed. No pertinent past medical history.  Patient Active Problem List   Diagnosis Date Noted  . Dog bite 07/29/2020  . Primary hypertension   . Status post surgery 07/28/2020    Past Surgical History:  Procedure Laterality Date  . CESAREAN SECTION    . I & D EXTREMITY Bilateral 07/28/2020   Procedure: IRRIGATION AND DEBRIDEMENT RIGHT ANTERIOR SHOULDER AND LEFT  ANTERIOR THIGH;  Surgeon: Cammy Copa, MD;  Location: [facility] OR;  Service: Orthopedics;  Laterality: Bilateral;     OB History   No obstetric history on file.     History reviewed. No pertinent family history.  Social History   Tobacco Use  . Smoking status: Never Smoker  . Smokeless tobacco: Never Used  Substance Use Topics  . Alcohol use: No  . Drug use: No    Home Medications Prior to Admission medications   Medication Sig Start Date End Date Taking? Authorizing Provider  amLODipine (NORVASC) 5 MG tablet Take 1 tablet (5 mg total) by mouth daily. 07/29/20 07/29/21  Emeline General, MD  amoxicillin-clavulanate (AUGMENTIN) 875-125 MG tablet Take 1 tablet by mouth 2 (two) times daily for 14 days. 07/29/20 08/12/20   Cammy Copa, MD  HYDROcodone-acetaminophen (NORCO/VICODIN) 5-325 MG tablet Take 1-2 tablets by mouth every 4 (four) hours as needed for moderate pain (pain score 4-6). 07/30/20   Magnant, Charles L, PA-C  methocarbamol (ROBAXIN) 500 MG tablet Take 1 tablet (500 mg total) by mouth every 6 (six) hours as needed for muscle spasms. 07/29/20   Cammy Copa, MD    Allergies    Patient has no known allergies.  Review of Systems   Review of Systems  Constitutional: Negative for fever.  Skin: Positive for wound.    Physical Exam Updated Vital Signs BP (!) 151/104 (BP Location: Left Arm)   Pulse 100   Temp 98.9 F (37.2 C)   Resp 18   LMP 07/21/2020   SpO2 100%   Physical Exam Vitals and nursing note reviewed.  Constitutional:      General: She is not in acute distress.    Appearance: She is well-developed.  HENT:     Head: Atraumatic.  Eyes:     Conjunctiva/sclera: Conjunctivae normal.  Musculoskeletal:     Cervical back: Neck supple.  Skin:    Findings: No rash.     Comments: Right shoulder: Dressing removed from the shoulder which reveals well-healing wound with sutures in place.  Minimal tenderness to palpation.  Mild erythema noted to axillary region but without tenderness.  Left anterior thigh: Well-appearing surgical wound with sutures in place nontender  to palpation with minimal erythema or warmth.  Neurological:     Mental Status: She is alert. Mental status is at baseline.  Psychiatric:        Mood and Affect: Mood normal.     ED Results / Procedures / Treatments   Labs (all labs ordered are listed, but only abnormal results are displayed) Labs Reviewed - No data to display  EKG None  Radiology No results found.  Procedures Procedures   Medications Ordered in ED Medications - No data to display  ED Course  I have reviewed the triage vital signs and the nursing notes.  Pertinent labs & imaging results that were available during my care of  the patient were reviewed by me and considered in my medical decision making (see chart for details).    MDM Rules/Calculators/A&P                          BP (!) 151/104 (BP Location: Left Arm)   Pulse 100   Temp 98.9 F (37.2 C)   Resp 18   LMP 07/21/2020   SpO2 100%   Final Clinical Impression(s) / ED Diagnoses Final diagnoses:  Visit for wound check    Rx / DC Orders ED Discharge Orders    None     1:30 PM Patient is here for wound recheck from prior dog bites to her right shoulder and left thigh that require surgical incision and drainage by orthopedist Dr. August Saucer 4 days ago.  I have inspected the wound and it appears to be healing appropriately without signs of infection.  Sutures are still in place.  Encourage patient to continue with wound care management and follow-up outpatient with orthopedist for further management and likely suture removal as previously recommended.   Fayrene Helper, PA-C 08/03/20 1700    Gerhard Munch, MD 08/06/20 [phone]

## 2020-08-03 ENCOUNTER — Ambulatory Visit (INDEPENDENT_AMBULATORY_CARE_PROVIDER_SITE_OTHER): Payer: Self-pay | Admitting: Surgical

## 2020-08-03 ENCOUNTER — Encounter: Payer: Self-pay | Admitting: Surgical

## 2020-08-03 ENCOUNTER — Other Ambulatory Visit: Payer: Self-pay

## 2020-08-03 DIAGNOSIS — M25511 Pain in right shoulder: Secondary | ICD-10-CM

## 2020-08-03 DIAGNOSIS — M25562 Pain in left knee: Secondary | ICD-10-CM

## 2020-08-03 DIAGNOSIS — W540XXD Bitten by dog, subsequent encounter: Secondary | ICD-10-CM

## 2020-08-04 ENCOUNTER — Encounter: Payer: Self-pay | Admitting: Surgical

## 2020-08-04 NOTE — Progress Notes (Signed)
   Post-Op Visit Note   Patient: Tina Walker           Date of Birth: [birth year]           MRN: [id number] Visit Date: 08/03/2020 PCP: Patient, No Pcp Per   Assessment & Plan:  Chief Complaint:  Chief Complaint  Patient presents with  . Right Shoulder - Pain  . Left Leg - Pain   Visit Diagnoses:  1. Dog bite, subsequent encounter     Plan: Patient is a 40 year old female who presents s/p right anterior shoulder and left anterior thigh irrigation and debridement following a dog bite injury on 07/28/2020.  She reports that she is doing well.  Her pain is well controlled.  Main complaint is itching around the incisions.  Recommended she take over-the-counter Benadryl.  She has been using hydrocortisone cream without relief.  Denies any fevers, chills, drainage from the incisions.  No night sweats.  She is compliant with taking her antibiotics as prescribed.  Examination reveals well-healing wounds with sutures intact and no expressible drainage from any wound.  No surrounding erythema.  Plan for patient to continue her antibiotics and finish out the entire course.  Emphasized the importance of this.  Patient understands.  Plan to follow-up on next Wednesday for suture removal.  Patient agreed with plan.          Follow-Up Instructions: No follow-ups on file.   Orders:  No orders of the defined types were placed in this encounter.  No orders of the defined types were placed in this encounter.   Imaging: No results found.  PMFS History: Patient Active Problem List   Diagnosis Date Noted  . Dog bite 07/29/2020  . Primary hypertension   . Status post surgery 07/28/2020   History reviewed. No pertinent past medical history.  History reviewed. No pertinent family history.  Past Surgical History:  Procedure Laterality Date  . CESAREAN SECTION    . I & D EXTREMITY Bilateral 07/28/2020   Procedure: IRRIGATION AND DEBRIDEMENT RIGHT ANTERIOR SHOULDER AND LEFT  ANTERIOR THIGH;   Surgeon: Cammy Copa, MD;  Location: [facility] OR;  Service: Orthopedics;  Laterality: Bilateral;   Social History   Occupational History  . Not on file  Tobacco Use  . Smoking status: Never Smoker  . Smokeless tobacco: Never Used  Substance and Sexual Activity  . Alcohol use: No  . Drug use: No  . Sexual activity: Not on file

## 2020-08-06 ENCOUNTER — Ambulatory Visit (INDEPENDENT_AMBULATORY_CARE_PROVIDER_SITE_OTHER): Payer: Self-pay | Admitting: Orthopedic Surgery

## 2020-08-06 ENCOUNTER — Other Ambulatory Visit: Payer: Self-pay

## 2020-08-06 DIAGNOSIS — M25511 Pain in right shoulder: Secondary | ICD-10-CM

## 2020-08-06 DIAGNOSIS — M25562 Pain in left knee: Secondary | ICD-10-CM

## 2020-08-06 DIAGNOSIS — W540XXD Bitten by dog, subsequent encounter: Secondary | ICD-10-CM

## 2020-08-07 ENCOUNTER — Encounter: Payer: Self-pay | Admitting: Orthopedic Surgery

## 2020-08-07 NOTE — Progress Notes (Signed)
   Post-Op Visit Note   Patient: Tina Walker           Date of Birth: September 02, 1980           MRN: [id number] Visit Date: 08/06/2020 PCP: Patient, No Pcp Per   Assessment & Plan:  Chief Complaint:  Chief Complaint  Patient presents with  . Right Shoulder - Routine Post Op  . Left Leg - Routine Post Op   Visit Diagnoses:  1. Dog bite, subsequent encounter     Plan: Tina Walker is a 40 year old patient had right shoulder left thigh dog bite I&D performed 07/28/2020.  She has been doing reasonably well.  No fevers or chills.  She is finishing antibiotics.  On exam the leg incisions look like they have healed nicely.  Sutures removed from here.  Shoulder also looks like it is healing.  No fluctuance erythema or induration.  The horizontal sutures are removed but the vertical sutures need to stay in for several more days.  We will have her come back on Friday for removal of those sutures.  Okay to start some gentle shoulder range of motion at that time as well.  Follow-Up Instructions: No follow-ups on file.   Orders:  No orders of the defined types were placed in this encounter.  No orders of the defined types were placed in this encounter.   Imaging: No results found.  PMFS History: Patient Active Problem List   Diagnosis Date Noted  . Dog bite 07/29/2020  . Primary hypertension   . Status post surgery 07/28/2020   History reviewed. No pertinent past medical history.  History reviewed. No pertinent family history.  Past Surgical History:  Procedure Laterality Date  . CESAREAN SECTION    . I & D EXTREMITY Bilateral 07/28/2020   Procedure: IRRIGATION AND DEBRIDEMENT RIGHT ANTERIOR SHOULDER AND LEFT  ANTERIOR THIGH;  Surgeon: Cammy Copa, MD;  Location: [facility] OR;  Service: Orthopedics;  Laterality: Bilateral;   Social History   Occupational History  . Not on file  Tobacco Use  . Smoking status: Never Smoker  . Smokeless tobacco: Never Used  Substance and Sexual Activity   . Alcohol use: No  . Drug use: No  . Sexual activity: Not on file

## 2020-08-09 DIAGNOSIS — S41151A Open bite of right upper arm, initial encounter: Secondary | ICD-10-CM

## 2020-08-09 DIAGNOSIS — S71152A Open bite, left thigh, initial encounter: Secondary | ICD-10-CM

## 2020-08-09 DIAGNOSIS — W540XXA Bitten by dog, initial encounter: Secondary | ICD-10-CM

## 2020-08-10 ENCOUNTER — Ambulatory Visit (INDEPENDENT_AMBULATORY_CARE_PROVIDER_SITE_OTHER): Payer: PRIVATE HEALTH INSURANCE | Admitting: Orthopedic Surgery

## 2020-08-10 DIAGNOSIS — S41151A Open bite of right upper arm, initial encounter: Secondary | ICD-10-CM

## 2020-08-10 DIAGNOSIS — W540XXD Bitten by dog, subsequent encounter: Secondary | ICD-10-CM

## 2020-08-12 ENCOUNTER — Encounter: Payer: Self-pay | Admitting: Orthopedic Surgery

## 2020-08-12 NOTE — Progress Notes (Signed)
   Post-Op Visit Note   Patient: Tina Walker           Date of Birth: 1980/07/21           MRN: [id number] Visit Date: 08/10/2020 PCP: Patient, No Pcp Per   Assessment & Plan:  Chief Complaint:  Chief Complaint  Patient presents with  . Right Shoulder - Follow-up  . Left Leg - Follow-up   Visit Diagnoses:  1. Dog bite, subsequent encounter     Plan: Patient presents for follow-up of dog bites to the left thigh and right shoulder.  On exam incisions look intact.  No induration or significant drainage.  All sutures removed today from the shoulder.  Left leg is functioning well.  Right shoulder has a little bit more range of motion but I anticipate due to the nature of the dog bite that her shoulder function should recover.  Instructed her to come back if she develops any induration or significant swelling or drainage from the incision which does go down into the axilla.  Follow-Up Instructions: Return if symptoms worsen or fail to improve.   Orders:  No orders of the defined types were placed in this encounter.  No orders of the defined types were placed in this encounter.   Imaging: No results found.  PMFS History: Patient Active Problem List   Diagnosis Date Noted  . Open wound of right upper arm due to dog bite   . Dog bite of left thigh without complication   . Dog bite 07/29/2020  . Primary hypertension   . Status post surgery 07/28/2020   History reviewed. No pertinent past medical history.  History reviewed. No pertinent family history.  Past Surgical History:  Procedure Laterality Date  . CESAREAN SECTION    . I & D EXTREMITY Bilateral 07/28/2020   Procedure: IRRIGATION AND DEBRIDEMENT RIGHT ANTERIOR SHOULDER AND LEFT  ANTERIOR THIGH;  Surgeon: Cammy Copa, MD;  Location: [facility] OR;  Service: Orthopedics;  Laterality: Bilateral;   Social History   Occupational History  . Not on file  Tobacco Use  . Smoking status: Never Smoker  . Smokeless tobacco:  Never Used  Substance and Sexual Activity  . Alcohol use: No  . Drug use: No  . Sexual activity: Not on file

## 2020-08-13 ENCOUNTER — Encounter: Payer: Self-pay | Admitting: Orthopedic Surgery

## 2020-08-17 ENCOUNTER — Encounter: Payer: Self-pay | Admitting: Surgical

## 2020-08-17 ENCOUNTER — Ambulatory Visit (INDEPENDENT_AMBULATORY_CARE_PROVIDER_SITE_OTHER): Payer: PRIVATE HEALTH INSURANCE | Admitting: Surgical

## 2020-08-17 ENCOUNTER — Other Ambulatory Visit: Payer: Self-pay

## 2020-08-17 DIAGNOSIS — S41151A Open bite of right upper arm, initial encounter: Secondary | ICD-10-CM

## 2020-08-17 DIAGNOSIS — W540XXD Bitten by dog, subsequent encounter: Secondary | ICD-10-CM

## 2020-08-17 NOTE — Progress Notes (Signed)
   Post-Op Visit Note   Patient: Tina Walker           Date of Birth: [birth year]           MRN: [id number] Visit Date: 08/17/2020 PCP: Patient, No Pcp Per (Inactive)   Assessment & Plan:  Chief Complaint:  Chief Complaint  Patient presents with  . Other    Check incision   Visit Diagnoses:  1. Dog bite, subsequent encounter     Plan: Patient is a 40 year old female who presents for follow-up of dog bite to the right shoulder and left thigh.  Left thigh incisions healing well with no evidence of infection or dehiscence.  Right shoulder lateral incision has healed well but the medial aspect of the incision has a wound that is healing secondarily.  She returns the office today to make sure that her wound is not infected.  She denies any fevers, chills, night sweats, significant drainage from the incision.  No foul-smelling odor from the scant drainage that she does have.  No significant pain.  She has no tenderness on the wound bed or throughout the surrounding skin.  No fluctuance is palpable.  There is no probing beneath the wound bed.  Overall, seems to be healing well secondarily without any sign of infection today.  Plan for patient to use dry dressing and change 1-2 times per day.  Educated her on the signs of infection to look out for.  She will follow-up with the office in 1 week for clinical recheck.      Follow-Up Instructions: No follow-ups on file.   Orders:  No orders of the defined types were placed in this encounter.  No orders of the defined types were placed in this encounter.   Imaging: No results found.  PMFS History: Patient Active Problem List   Diagnosis Date Noted  . Open wound of right upper arm due to dog bite   . Dog bite of left thigh without complication   . Dog bite 07/29/2020  . Primary hypertension   . Status post surgery 07/28/2020   No past medical history on file.  No family history on file.  Past Surgical History:  Procedure  Laterality Date  . CESAREAN SECTION    . I & D EXTREMITY Bilateral 07/28/2020   Procedure: IRRIGATION AND DEBRIDEMENT RIGHT ANTERIOR SHOULDER AND LEFT  ANTERIOR THIGH;  Surgeon: Cammy Copa, MD;  Location: [facility] OR;  Service: Orthopedics;  Laterality: Bilateral;   Social History   Occupational History  . Not on file  Tobacco Use  . Smoking status: Never Smoker  . Smokeless tobacco: Never Used  Substance and Sexual Activity  . Alcohol use: No  . Drug use: No  . Sexual activity: Not on file

## 2020-08-24 ENCOUNTER — Ambulatory Visit (INDEPENDENT_AMBULATORY_CARE_PROVIDER_SITE_OTHER): Payer: PRIVATE HEALTH INSURANCE | Admitting: Surgical

## 2020-08-24 DIAGNOSIS — W540XXD Bitten by dog, subsequent encounter: Secondary | ICD-10-CM

## 2020-08-24 DIAGNOSIS — S41151A Open bite of right upper arm, initial encounter: Secondary | ICD-10-CM

## 2020-08-25 ENCOUNTER — Encounter: Payer: Self-pay | Admitting: Surgical

## 2020-08-25 NOTE — Progress Notes (Signed)
   Post-Op Visit Note   Patient: Fauna Neuner           Date of Birth: May 05, 1981           MRN: [id number] Visit Date: 08/24/2020 PCP: Patient, No Pcp Per (Inactive)   Assessment & Plan:  Chief Complaint: No chief complaint on file.  Visit Diagnoses: No diagnosis found.  Plan: Patient is a 40 year old female who presents for reevaluation of right shoulder wound.  She has returned after her visit last week.  She feels that it is healing well.  She has been using dry dressings.  Denies any fevers, chills, night sweats, drainage from the incision/wound.  She does report that she feels she has 2 sutures that are remaining.  The sutures were identified in the axilla and removed.  There was about 1 drop of scant drainage that was noted but nothing that was expressible beyond that.  There is no significant warmth around the wound.  Overall she has healed extremely well over the last week and there are no real concerns at this time.  The wound was probed with a Q-tip and did not probe below the surface of the wound.  Plan for reevaluation in 2 weeks with Dr. August Saucer.  Continue dry dressings      Follow-Up Instructions: No follow-ups on file.   Orders:  No orders of the defined types were placed in this encounter.  No orders of the defined types were placed in this encounter.   Imaging: No results found.  PMFS History: Patient Active Problem List   Diagnosis Date Noted  . Open wound of right upper arm due to dog bite   . Dog bite of left thigh without complication   . Dog bite 07/29/2020  . Primary hypertension   . Status post surgery 07/28/2020   No past medical history on file.  No family history on file.  Past Surgical History:  Procedure Laterality Date  . CESAREAN SECTION    . I & D EXTREMITY Bilateral 07/28/2020   Procedure: IRRIGATION AND DEBRIDEMENT RIGHT ANTERIOR SHOULDER AND LEFT  ANTERIOR THIGH;  Surgeon: Cammy Copa, MD;  Location: [facility] OR;  Service: Orthopedics;   Laterality: Bilateral;   Social History   Occupational History  . Not on file  Tobacco Use  . Smoking status: Never Smoker  . Smokeless tobacco: Never Used  Substance and Sexual Activity  . Alcohol use: No  . Drug use: No  . Sexual activity: Not on file

## 2020-09-07 ENCOUNTER — Ambulatory Visit (INDEPENDENT_AMBULATORY_CARE_PROVIDER_SITE_OTHER): Payer: PRIVATE HEALTH INSURANCE | Admitting: Surgical

## 2020-09-07 ENCOUNTER — Encounter: Payer: Self-pay | Admitting: Surgical

## 2020-09-07 ENCOUNTER — Other Ambulatory Visit: Payer: Self-pay

## 2020-09-07 DIAGNOSIS — W540XXD Bitten by dog, subsequent encounter: Secondary | ICD-10-CM

## 2020-09-07 DIAGNOSIS — S41151A Open bite of right upper arm, initial encounter: Secondary | ICD-10-CM

## 2020-09-07 DIAGNOSIS — S71152A Open bite, left thigh, initial encounter: Secondary | ICD-10-CM

## 2020-09-07 NOTE — Progress Notes (Signed)
   Post-Op Visit Note   Patient: Tina Walker           Date of Birth: Feb 17, 1981           MRN: [id number] Visit Date: 09/07/2020 PCP: Patient, No Pcp Per (Inactive)   Assessment & Plan:  Chief Complaint:  Chief Complaint  Patient presents with  . Right Arm - Follow-up  . Left Thigh - Follow-up   Visit Diagnoses:  1. Dog bite, subsequent encounter     Plan: Patient returns for follow-up following dog bite injury.  Last seen 2 weeks ago.  She reports that she is doing very well and her wound has continued to heal to the point that there is no wound bed to fill in anymore.  There is no evidence of dehiscence.  No increased warmth or any expressible drainage noted on exam today.  Patient denies any fevers, chills, drainage at home.  She does report that she had increased pain on Tuesday that caused her to wake up at night.  This pain decreased over the course of Wednesday and Thursday and today is not causing her any difficulty.  She does complain of some lack of motion of her shoulder and by exam she lacks about 5 to 10 degrees of passive abduction and 10 degrees of passive forward flexion compared with contralateral side.  No loss of external rotation.  Excellent rotator cuff strength and full functional range of motion that is equivalent to her passive motion.  Suspect this is due to the scar tissue in the anterior shoulder.  Suspect the increased pain that she is having is likely due to some of the nerve regeneration and advised her that she may experience more of this increased pain.  Overall she is doing very well and the wound seems to be healed completely.  Scar tissue will continue to mature over the coming months but no need for follow-up at this time.  She will follow-up as needed.  Advised her on the symptoms that would necessitate her return to the office such as fevers, chills, drainage from incision, opening of the incision, increased pain severely, redness.  Follow-Up  Instructions: No follow-ups on file.   Orders:  No orders of the defined types were placed in this encounter.  No orders of the defined types were placed in this encounter.   Imaging: No results found.  PMFS History: Patient Active Problem List   Diagnosis Date Noted  . Open wound of right upper arm due to dog bite   . Dog bite of left thigh without complication   . Dog bite 07/29/2020  . Primary hypertension   . Status post surgery 07/28/2020   History reviewed. No pertinent past medical history.  History reviewed. No pertinent family history.  Past Surgical History:  Procedure Laterality Date  . CESAREAN SECTION    . I & D EXTREMITY Bilateral 07/28/2020   Procedure: IRRIGATION AND DEBRIDEMENT RIGHT ANTERIOR SHOULDER AND LEFT  ANTERIOR THIGH;  Surgeon: Cammy Copa, MD;  Location: [facility] OR;  Service: Orthopedics;  Laterality: Bilateral;   Social History   Occupational History  . Not on file  Tobacco Use  . Smoking status: Never Smoker  . Smokeless tobacco: Never Used  Substance and Sexual Activity  . Alcohol use: No  . Drug use: No  . Sexual activity: Not on file

## 2020-09-14 ENCOUNTER — Other Ambulatory Visit: Payer: Self-pay | Admitting: Surgical

## 2020-09-14 MED ORDER — GABAPENTIN 300 MG PO CAPS: 300.0000 mg | ORAL_CAPSULE | Freq: Three times a day (TID) | ORAL | 0 refills | Status: DC

## 2020-09-14 NOTE — Telephone Encounter (Signed)
Called her to discuss. No signs of infection, sounds like nerve-related pain. Rxed gabapentin to see if this will help but she will call the office next Monday if continued, unabating pain for eval. Encouraged her to go to UC or ED over the weekend if signs of infection develop.  She agreed with plan

## 2020-09-21 ENCOUNTER — Encounter: Payer: Self-pay | Admitting: Surgical

## 2020-09-21 ENCOUNTER — Ambulatory Visit: Payer: Self-pay

## 2020-09-21 ENCOUNTER — Ambulatory Visit: Payer: PRIVATE HEALTH INSURANCE | Admitting: Surgical

## 2020-09-21 DIAGNOSIS — M25511 Pain in right shoulder: Secondary | ICD-10-CM

## 2020-09-28 MED ORDER — LIDOCAINE 5 % EX PTCH: 1.0000 | MEDICATED_PATCH | CUTANEOUS | 0 refills | Status: DC

## 2020-10-26 ENCOUNTER — Encounter: Payer: PRIVATE HEALTH INSURANCE | Admitting: Surgical

## 2020-11-09 ENCOUNTER — Ambulatory Visit (INDEPENDENT_AMBULATORY_CARE_PROVIDER_SITE_OTHER): Payer: BLUE CROSS/BLUE SHIELD | Admitting: Surgical

## 2020-11-09 ENCOUNTER — Other Ambulatory Visit: Payer: Self-pay

## 2020-11-09 ENCOUNTER — Encounter: Payer: Self-pay | Admitting: Surgical

## 2020-11-09 VITALS — Ht 64.0 in | Wt 180.0 lb

## 2020-11-09 DIAGNOSIS — R200 Anesthesia of skin: Secondary | ICD-10-CM | POA: Diagnosis not present

## 2020-11-09 NOTE — Progress Notes (Signed)
Office Visit Note   Patient: Tina Walker           Date of Birth: 31-Dec-1980           MRN: [id number] Visit Date: 11/09/2020 Requested by: No referring provider defined for this encounter. PCP: Patient, No Pcp Per (Inactive)  Subjective: Chief Complaint  Patient presents with   Right Arm - Follow-up   Left Leg - Follow-up    HPI: Tina Walker is a 40 y.o. female who presents to the office complaining of right shoulder pain and right arm pain.  Patient complains of continued pain all the time in her right shoulder traveling down her right arm.  She states that no medications or patches help at all.  Her right shoulder hurts all the time.  She has no trouble with her left leg.  She has history of dog bite injury back in March 2022.  Denies any fevers, chills, night sweats, malaise, drainage from the shoulder.  She reports burning and sharp pain in the right shoulder that travels down into the palm of her right hand.  She has no neck pain or scapular pain.  She had no radiation of pain down her arm prior to her injury.  She denies any weakness aside from some difficulty opening bottles or jars..                ROS: All systems reviewed are negative as they relate to the chief complaint within the history of present illness.  Patient denies fevers or chills.  Assessment & Plan: Visit Diagnoses:  1. Right arm numbness     Plan: Patient is a 40 year old female who presents complaining of continued right arm pain.  She has numbness and tingling in the arm with burning and sharp pain that radiates from her shoulder down into her palm.  She has had continued pain since several weeks after her dog bite injury.  Nothing she has tried has made her pain better and is getting to the point where it is interfering with her sleep and with her daily life.  By her history and examination today, no significant concern for infection of the right shoulder.  Most of her symptoms seem to be nerve related  which could be just from the nerves regaining their function from the injury but with the severity and the radicular nature down into her palm, plan to order nerve conduction study of the right upper extremity for further evaluation.  Also there may be some contribution of bicep tendon pathology proximally in the right shoulder given the point tenderness over the tendon but plan to evaluate with nerve conduction study first per patient's request.  Follow-Up Instructions: No follow-ups on file.   Orders:  Orders Placed This Encounter  Procedures   Ambulatory referral to Physical Medicine Rehab   No orders of the defined types were placed in this encounter.     Procedures: No procedures performed   Clinical Data: No additional findings.  Objective: Vital Signs: Ht 5\' 4"  (1.626 m)   Wt 180 lb (81.6 kg)   BMI 30.90 kg/m   Physical Exam:  Constitutional: Patient appears well-developed HEENT:  Head: Normocephalic Eyes:EOM are normal Neck: Normal range of motion Cardiovascular: Normal rate Pulmonary/chest: Effort normal Neurologic: Patient is alert Skin: Skin is warm Psychiatric: Patient has normal mood and affect  Ortho Exam: Ortho exam demonstrates right shoulder 40 degrees external rotation, 95 degrees abduction, 170 degrees forward flexion.  Keloid scar over the  anterior shoulder from prior dog bite injury.  No expressible drainage or fluctuance noted.  No significant warmth over the right shoulder.  Axillary nerve intact with deltoid firing.  Excellent supraspinatus, infraspinatus, subscapularis strength with very minimal pain with subscap.  Point tenderness over the bicep tendon that is palpable proximally.  No crepitus noted with passive motion of the shoulder.  Negative Hornblower sign.  Negative belly press test.  No weakness of grip strength, finger abduction, pronation/supination, bicep, tricep, deltoid.  No tenderness throughout the axial cervical spine.  Specialty  Comments:  No specialty comments available.  Imaging: No results found.   PMFS History: Patient Active Problem List   Diagnosis Date Noted   Open wound of right upper arm due to dog bite    Dog bite of left thigh without complication    Dog bite 07/29/2020   Primary hypertension    Status post surgery 07/28/2020   No past medical history on file.  No family history on file.  Past Surgical History:  Procedure Laterality Date   CESAREAN SECTION     I & D EXTREMITY Bilateral 07/28/2020   Procedure: IRRIGATION AND DEBRIDEMENT RIGHT ANTERIOR SHOULDER AND LEFT  ANTERIOR THIGH;  Surgeon: Cammy Copa, MD;  Location: [facility] OR;  Service: Orthopedics;  Laterality: Bilateral;   Social History   Occupational History   Not on file  Tobacco Use   Smoking status: Never   Smokeless tobacco: Never  Substance and Sexual Activity   Alcohol use: No   Drug use: No   Sexual activity: Not on file

## 2021-01-04 ENCOUNTER — Other Ambulatory Visit: Payer: Self-pay

## 2021-01-04 ENCOUNTER — Encounter: Payer: Self-pay | Admitting: Physical Medicine and Rehabilitation

## 2021-01-04 ENCOUNTER — Ambulatory Visit (INDEPENDENT_AMBULATORY_CARE_PROVIDER_SITE_OTHER): Payer: Self-pay | Admitting: Physical Medicine and Rehabilitation

## 2021-01-04 DIAGNOSIS — R202 Paresthesia of skin: Secondary | ICD-10-CM | POA: Diagnosis not present

## 2021-01-04 NOTE — Progress Notes (Signed)
Surgery after being attacked by dog. Right arm and hand pain. Tingling in right hand. Difficulty sleeping due to the pain.  Right hand dominant No lotion per patient

## 2021-01-07 NOTE — Procedures (Signed)
EMG & NCV Findings: Evaluation of the right median motor nerve showed prolonged distal onset latency (4.6 ms) and decreased conduction velocity (Elbow-Wrist, 48 m/s).  The right median (across palm) sensory nerve showed no response (Palm) and prolonged distal peak latency (3.9 ms).  All remaining nerves (as indicated in the following tables) were within normal limits.    All examined muscles (as indicated in the following table) showed no evidence of electrical instability.    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  Except for the obvious incident of the dog bite and surgical closure her symptoms are consistent with a carpal tunnel syndrome.  This test cannot rule out proximal demyelinating nerve injury.  There is no significant electrodiagnostic evidence of any other focal nerve entrapment, brachial plexopathy or cervical radiculopathy in the right upper limb.   Recommendations: 1.  Follow-up with referring physician. 2.  Continue current management of symptoms. 3.  Continue use of resting splint at night-time and as needed during the day. 4.  Suggest diagnostic carpal tunnel injection and potential surgical evaluation.  ___________________________ Naaman Plummer FAAPMR Board Certified, American Board of Physical Medicine and Rehabilitation    Nerve Conduction Studies Anti Sensory Summary Table   Stim Site NR Peak (ms) Norm Peak (ms) P-T Amp (V) Norm P-T Amp Site1 Site2 Delta-P (ms) Dist (cm) Vel (m/s) Norm Vel (m/s)  Right Median Acr Palm Anti Sensory (2nd Digit)  29.8C  Wrist    *3.9 <3.6 39.8 >10 Wrist Palm  0.0    Palm *NR  <2.0          Right Radial Anti Sensory (Base 1st Digit)  29.8C  Wrist    2.4 <3.1 30.0  Wrist Base 1st Digit 2.4 0.0    Right Ulnar Anti Sensory (5th Digit)  30.1C  Wrist    3.6 <3.7 37.8 >15.0  Wrist 5th Digit 3.6 14.0 39 >38   Motor Summary Table   Stim Site NR Onset (ms) Norm Onset (ms) O-P Amp (mV) Norm O-P Amp Site1 Site2 Delta-0 (ms) Dist (cm) Vel (m/s) Norm Vel (m/s)  Right Median Motor (Abd Poll Brev)  30C  Wrist    *4.6 <4.2 12.1 >5 Elbow Wrist 4.4 21.0 *48 >50  Elbow    9.0  11.7         Right Ulnar Motor (Abd Dig Min)  30.3C  Wrist    3.0 <4.2 12.7 >3 B Elbow Wrist 3.6 19.5 54 >53  B Elbow    6.6  10.1  A Elbow B Elbow 1.2 6.5 54 >53  A Elbow    7.8  10.9          EMG   Side Muscle Nerve Root Ins Act Fibs Psw Amp Dur Poly Recrt Int Dennie Bible Comment  Right Abd Poll Brev Median C8-T1 Nml Nml Nml Nml Nml 0 Nml Nml   Right 1stDorInt Ulnar C8-T1 Nml Nml Nml Nml Nml 0 Nml Nml   Right PronatorTeres Median C6-7 Nml Nml Nml Nml Nml 0 Nml Nml   Right Biceps Musculocut C5-6 Nml Nml Nml Nml Nml 0 Nml Nml   Right Deltoid Axillary C5-6 Nml Nml Nml Nml Nml 0 Nml Nml     Nerve Conduction Studies Anti Sensory Left/Right Comparison   Stim Site L Lat (ms) R Lat (ms) L-R Lat (ms) L Amp (V) R Amp (V) L-R Amp (%)  Site1 Site2 L Vel (m/s) R Vel (m/s) L-R Vel (m/s)  Median Acr Palm Anti Sensory (2nd Digit)  29.8C  Wrist  *3.9   39.8  Wrist Palm     Palm             Radial Anti Sensory (Base 1st Digit)  29.8C  Wrist  2.4   30.0  Wrist Base 1st Digit     Ulnar Anti Sensory (5th Digit)  30.1C  Wrist  3.6   37.8  Wrist 5th Digit  39    Motor Left/Right Comparison   Stim Site L Lat (ms) R Lat (ms) L-R Lat (ms) L Amp (mV) R Amp (mV) L-R Amp (%) Site1 Site2 L Vel (m/s) R Vel (m/s) L-R Vel (m/s)  Median Motor (Abd Poll Brev)  30C  Wrist  *4.6   12.1  Elbow Wrist  *48   Elbow  9.0   11.7        Ulnar Motor (Abd Dig Min)  30.3C  Wrist  3.0   12.7  B Elbow Wrist  54   B Elbow  6.6   10.1  A Elbow B Elbow  54   A Elbow  7.8   10.9           Waveforms:

## 2021-01-07 NOTE — Progress Notes (Signed)
Tina Walker - 40 y.o. female MRN [id number]  Date of birth: Oct 11, 1980  Office Visit Note: Visit Date: 01/04/2021 PCP: Patient, No Pcp Per (Inactive) Referred by: Julieanne Cotton, PA-C  Subjective: Chief Complaint  Patient presents with   Right Arm - Numbness, Pain   HPI:  Tina Walker is a 40 y.o. female who comes in today at the request of Karenann Cai, PA-C for electrodiagnostic study of the Right upper extremities.  Patient is Right hand dominant.  She reports dog bite injury to the anterior upper shoulder status post debridement and irrigation and closure by Dr. August Saucer.  This was in March of this year.  Since that time she reports generalized right arm and hand pain.  Some pain and numbness from right arm to the palm of the hand.  No symptoms on the left.  She gets some paresthesias in the right hand in the radial digits.  She has some difficulty sleeping with nocturnal complaints of the right hand.  No prior history electrodiagnostic study.  No frank radicular symptoms.   ROS Otherwise per HPI.  Assessment & Plan: Visit Diagnoses:    ICD-10-CM   1. 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  Except for the obvious incident of the dog bite and surgical closure her symptoms are consistent with a carpal tunnel syndrome.  This test cannot rule out proximal demyelinating nerve injury.  There is no significant electrodiagnostic evidence of any other focal nerve entrapment, brachial plexopathy or cervical radiculopathy in the right upper limb.   Recommendations: 1.  Follow-up with referring physician. 2.  Continue current management of symptoms. 3.  Continue use of resting splint at night-time and as needed during the day. 4.  Suggest diagnostic carpal  tunnel injection and potential surgical evaluation.  Meds & Orders: No orders of the defined types were placed in this encounter.   Orders Placed This Encounter  Procedures   NCV with EMG (electromyography)    Follow-up: Return for  G. Dorene Grebe, MD.   Procedures: No procedures performed  EMG & NCV Findings: Evaluation of the right median motor nerve showed prolonged distal onset latency (4.6 ms) and decreased conduction velocity (Elbow-Wrist, 48 m/s).  The right median (across palm) sensory nerve showed no response (Palm) and prolonged distal peak latency (3.9 ms).  All remaining nerves (as indicated in the following tables) were within normal limits.    All examined muscles (as indicated in the following table) showed no evidence of electrical instability.    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  Except for the obvious incident of the dog bite and surgical closure her symptoms are consistent with a carpal tunnel syndrome.  This test cannot rule out proximal demyelinating nerve injury.  There is no significant electrodiagnostic evidence of any other focal nerve entrapment, brachial plexopathy or cervical radiculopathy in the right upper limb.   Recommendations: 1.  Follow-up with referring physician. 2.  Continue current management of symptoms. 3.  Continue use of resting splint at night-time and as needed during the day. 4.  Suggest diagnostic carpal tunnel injection and potential surgical evaluation.  ___________________________ Tina Walker [facility] Board Certified,  Biomedical engineer of Physical Medicine and Rehabilitation    Nerve Conduction Studies Anti Sensory Summary Table   Stim Site NR Peak (ms) Norm Peak (ms) P-T Amp (V) Norm P-T Amp Site1 Site2 Delta-P (ms) Dist (cm) Vel (m/s) Norm Vel (m/s)  Right Median Acr  Palm Anti Sensory (2nd Digit)  29.8C  Wrist    *3.9 <3.6 39.8 >10 Wrist Palm  0.0    Palm *NR  <2.0          Right Radial Anti Sensory (Base 1st Digit)  29.8C  Wrist    2.4 <3.1 30.0  Wrist Base 1st Digit 2.4 0.0    Right Ulnar Anti Sensory (5th Digit)  30.1C  Wrist    3.6 <3.7 37.8 >15.0 Wrist 5th Digit 3.6 14.0 39 >38   Motor Summary Table   Stim Site NR Onset (ms) Norm Onset (ms) O-P Amp (mV) Norm O-P Amp Site1 Site2 Delta-0 (ms) Dist (cm) Vel (m/s) Norm Vel (m/s)  Right Median Motor (Abd Poll Brev)  30C  Wrist    *4.6 <4.2 12.1 >5 Elbow Wrist 4.4 21.0 *48 >50  Elbow    9.0  11.7         Right Ulnar Motor (Abd Dig Min)  30.3C  Wrist    3.0 <4.2 12.7 >3 B Elbow Wrist 3.6 19.5 54 >53  B Elbow    6.6  10.1  A Elbow B Elbow 1.2 6.5 54 >53  A Elbow    7.8  10.9          EMG   Side Muscle Nerve Root Ins Act Fibs Psw Amp Dur Poly Recrt Int Dennie Bible Comment  Right Abd Poll Brev Median C8-T1 Nml Nml Nml Nml Nml 0 Nml Nml   Right 1stDorInt Ulnar C8-T1 Nml Nml Nml Nml Nml 0 Nml Nml   Right PronatorTeres Median C6-7 Nml Nml Nml Nml Nml 0 Nml Nml   Right Biceps Musculocut C5-6 Nml Nml Nml Nml Nml 0 Nml Nml   Right Deltoid Axillary C5-6 Nml Nml Nml Nml Nml 0 Nml Nml     Nerve Conduction Studies Anti Sensory Left/Right Comparison   Stim Site L Lat (ms) R Lat (ms) L-R Lat (ms) L Amp (V) R Amp (V) L-R Amp (%) Site1 Site2 L Vel (m/s) R Vel (m/s) L-R Vel (m/s)  Median Acr Palm Anti Sensory (2nd Digit)  29.8C  Wrist  *3.9   39.8  Wrist Palm     Palm             Radial Anti Sensory (Base 1st Digit)  29.8C  Wrist  2.4   30.0  Wrist Base 1st Digit     Ulnar Anti Sensory (5th Digit)  30.1C  Wrist  3.6   37.8  Wrist 5th Digit  39    Motor Left/Right Comparison   Stim Site L Lat (ms) R Lat (ms) L-R Lat (ms) L Amp (mV) R Amp (mV) L-R Amp (%) Site1 Site2 L Vel (m/s) R Vel (m/s) L-R Vel (m/s)  Median Motor (Abd Poll Brev)  30C  Wrist  *4.6   12.1  Elbow Wrist  *48   Elbow  9.0   11.7         Ulnar Motor (Abd Dig Min)  30.3C  Wrist  3.0   12.7  B Elbow Wrist  54   B Elbow  6.6   10.1  A Elbow B Elbow  54   A Elbow  7.8  10.9           Waveforms:            Clinical History: No specialty comments available.     Objective:  VS:  HT:    WT:   BMI:     BP:   HR: bpm  TEMP: ( )  RESP:  Physical Exam Musculoskeletal:        General: No swelling, tenderness or deformity.     Comments: Inspection reveals well-healed surgical scars of the dog bite in the right shoulder as well as no atrophy of the bilateral APB or FDI or hand intrinsics. There is no swelling, color changes, allodynia or dystrophic changes. There is 5 out of 5 strength in the bilateral wrist extension, finger abduction and long finger flexion. There is intact sensation to light touch in all dermatomal and peripheral nerve distributions.  There is a negative Hoffmann's test bilaterally.  Skin:    General: Skin is warm and dry.     Findings: No erythema or rash.  Neurological:     General: No focal deficit present.     Mental Status: She is alert and oriented to person, place, and time.     Motor: No weakness or abnormal muscle tone.     Coordination: Coordination normal.  Psychiatric:        Mood and Affect: Mood normal.        Behavior: Behavior normal.     Imaging: No results found.

## 2021-01-23 ENCOUNTER — Ambulatory Visit (INDEPENDENT_AMBULATORY_CARE_PROVIDER_SITE_OTHER): Payer: Self-pay | Admitting: Orthopedic Surgery

## 2021-01-23 DIAGNOSIS — G5601 Carpal tunnel syndrome, right upper limb: Secondary | ICD-10-CM

## 2021-01-27 ENCOUNTER — Encounter: Payer: Self-pay | Admitting: Orthopedic Surgery

## 2021-01-27 NOTE — Progress Notes (Signed)
Office Visit Note   Patient: Tina Walker           Date of Birth: 10/25/1980           MRN: [id number] Visit Date: 01/23/2021 Requested by: No referring provider defined for this encounter. PCP: Patient, No Pcp Per (Inactive)  Subjective: Chief Complaint  Patient presents with   Other    EMG/NCV review study    HPI: Tina Walker is a 40 y.o. female who returns to the office for follow-up visit.  Plan from last visit was to obtain nerve conduction study to evaluate for right-sided numbness and tingling and neuropathic pain.  She previously had pain primarily in the right shoulder with associated tingling sensation and pain that was waking her up at night..  Patient now returns with significant improvement compared with prior appointment in relation to her shoulder pain.  Reports most of her pain is now in her right hand that she localizes to the right palm.  The right palm "stays numb".  Makes it difficult to drive.  She has had nerve conduction study by Dr. Alvester Walker that demonstrated moderate right carpal tunnel syndrome.  She denies any numbness or tingling or pain in the dorsum of the hand.  She has no extension of the palmar numbness into the fingertips of the right hand..  Denies any radicular pain down the arm.  No neck pain..                ROS: All systems reviewed are negative as they relate to the chief complaint within the history of present illness.  Patient denies fevers or chills.  Assessment & Plan: Visit Diagnoses:  1. Carpal tunnel syndrome, right upper limb     Plan: Tina Walker is a 40 y.o. female who returns to the office for follow-up visit for right arm pain.  Plan from last visit was nerve conduction study.  They now return with nerve conduction study demonstrating right carpal tunnel syndrome.  Previously most of her symptoms were in the right shoulder but now over the last couple months she has had persistent numbness and tingling in the palm of the right hand.   This is bothersome to her every day all day.  She does not have any numbness or tingling in the fingertips.  She does have increased discomfort and tingling sensation with Tinel sign and Phalen sign.  Discussed the options available to patient including trying bracing, injection, carpal tunnel release.  After discussion of options, she would like definitive management of this and would like to proceed with carpal tunnel release.  Discussed the risks and benefits of the procedure including the risk of infection, nerve/vessel damage, incomplete resolution of her symptoms, scar tissue formation resulting in discomfort for several months, decreased grip strength with 3 to 4 months, need for revision surgery.  After discussion, patient still would like to proceed.  Plan to post her for carpal tunnel release of the right wrist and follow-up after procedure.  Follow-Up Instructions: No follow-ups on file.   Orders:  No orders of the defined types were placed in this encounter.  No orders of the defined types were placed in this encounter.     Procedures: No procedures performed   Clinical Data: No additional findings.  Objective: Vital Signs: There were no vitals taken for this visit.  Physical Exam:  Constitutional: Patient appears well-developed HEENT:  Head: Normocephalic Eyes:EOM are normal Neck: Normal range of motion Cardiovascular: Normal rate Pulmonary/chest: Effort  normal Neurologic: Patient is alert Skin: Skin is warm Psychiatric: Patient has normal mood and affect  Ortho Exam: Ortho exam demonstrates decreased sensation throughout the palm.  Intact sensation to all 5 fingertips on the volar and dorsal aspects.  2+ radial pulse of the right upper extremity.  Positive Phalen sign.  Positive Tinel sign.  No subluxing ulnar nerve noted.  No pain with passive motion of the right shoulder.  No pain with active range of motion of the cervical spine.  Negative Spurling sign.  Intact EPL,  wrist extension, finger abduction, finger adduction.  Specialty Comments:  No specialty comments available.  Imaging: No results found.   PMFS History: Patient Active Problem List   Diagnosis Date Noted   Open wound of right upper arm due to dog bite    Dog bite of left thigh without complication    Dog bite 07/29/2020   Primary hypertension    Status post surgery 07/28/2020   No past medical history on file.  No family history on file.  Past Surgical History:  Procedure Laterality Date   CESAREAN SECTION     I & D EXTREMITY Bilateral 07/28/2020   Procedure: IRRIGATION AND DEBRIDEMENT RIGHT ANTERIOR SHOULDER AND LEFT  ANTERIOR THIGH;  Surgeon: Cammy Copa, MD;  Location: [facility] OR;  Service: Orthopedics;  Laterality: Bilateral;   Social History   Occupational History   Not on file  Tobacco Use   Smoking status: Never   Smokeless tobacco: Never  Substance and Sexual Activity   Alcohol use: No   Drug use: No   Sexual activity: Not on file

## 2021-02-05 ENCOUNTER — Other Ambulatory Visit: Payer: Self-pay

## 2021-02-05 ENCOUNTER — Encounter (HOSPITAL_BASED_OUTPATIENT_CLINIC_OR_DEPARTMENT_OTHER): Payer: Self-pay | Admitting: Orthopedic Surgery

## 2021-02-07 ENCOUNTER — Encounter (HOSPITAL_BASED_OUTPATIENT_CLINIC_OR_DEPARTMENT_OTHER)
Admission: RE | Admit: 2021-02-07 | Discharge: 2021-02-07 | Disposition: A | Discharge status: Home / Self Care | Payer: Managed Care, Other (non HMO) | Source: Ambulatory Visit | Attending: Orthopedic Surgery | Admitting: Orthopedic Surgery

## 2021-02-07 DIAGNOSIS — Z01812 Encounter for preprocedural laboratory examination: Secondary | ICD-10-CM | POA: Insufficient documentation

## 2021-02-07 LAB — CBC
HCT: 35.5 % — ABNORMAL LOW (ref 36.0–46.0)
Hemoglobin: 11.1 g/dL — ABNORMAL LOW (ref 12.0–15.0)
MCH: 24.3 pg — ABNORMAL LOW (ref 26.0–34.0)
MCHC: 31.3 g/dL (ref 30.0–36.0)
MCV: 77.7 fL — ABNORMAL LOW (ref 80.0–100.0)
Platelets: 226 10*3/uL (ref 150–400)
RBC: 4.57 MIL/uL (ref 3.87–5.11)
RDW: 18.6 % — ABNORMAL HIGH (ref 11.5–15.5)
WBC: 7.2 10*3/uL (ref 4.0–10.5)
nRBC: 0 % (ref 0.0–0.2)

## 2021-02-07 LAB — BASIC METABOLIC PANEL
Anion gap: 8 (ref 5–15)
BUN: <5 mg/dL — ABNORMAL LOW (ref 6–20)
CO2: 25 mmol/L (ref 22–32)
Calcium: 9.6 mg/dL (ref 8.9–10.3)
Chloride: 106 mmol/L (ref 98–111)
Creatinine, Ser: 0.72 mg/dL (ref 0.44–1.00)
GFR, Estimated: >60 mL/min (ref >60)
Glucose, Bld: 82 mg/dL (ref 70–99)
Potassium: 3.7 mmol/L (ref 3.5–5.1)
Sodium: 139 mmol/L (ref 135–145)

## 2021-02-07 LAB — POCT PREGNANCY, URINE: Preg Test, Ur: NEGATIVE

## 2021-02-07 NOTE — Progress Notes (Signed)

## 2021-02-11 ENCOUNTER — Other Ambulatory Visit: Payer: Self-pay

## 2021-02-11 ENCOUNTER — Encounter (HOSPITAL_COMMUNITY): Payer: Self-pay | Admitting: Anesthesiology

## 2021-02-11 ENCOUNTER — Encounter (HOSPITAL_BASED_OUTPATIENT_CLINIC_OR_DEPARTMENT_OTHER): Payer: Self-pay | Admitting: Orthopedic Surgery

## 2021-02-11 ENCOUNTER — Encounter (HOSPITAL_BASED_OUTPATIENT_CLINIC_OR_DEPARTMENT_OTHER)
Admission: RE | Disposition: A | Discharge status: Home / Self Care | Payer: Self-pay | Source: Home / Self Care | Attending: Orthopedic Surgery

## 2021-02-11 ENCOUNTER — Ambulatory Visit (HOSPITAL_BASED_OUTPATIENT_CLINIC_OR_DEPARTMENT_OTHER)
Admission: RE | Admit: 2021-02-11 | Discharge: 2021-02-11 | Disposition: A | Discharge status: Home / Self Care | Payer: PRIVATE HEALTH INSURANCE | Attending: Orthopedic Surgery | Admitting: Orthopedic Surgery

## 2021-02-11 DIAGNOSIS — R030 Elevated blood-pressure reading, without diagnosis of hypertension: Secondary | ICD-10-CM | POA: Insufficient documentation

## 2021-02-11 DIAGNOSIS — G5601 Carpal tunnel syndrome, right upper limb: Secondary | ICD-10-CM | POA: Insufficient documentation

## 2021-02-11 DIAGNOSIS — Z5309 Procedure and treatment not carried out because of other contraindication: Secondary | ICD-10-CM | POA: Insufficient documentation

## 2021-02-11 HISTORY — DX: Essential (primary) hypertension: I10

## 2021-02-11 LAB — POCT PREGNANCY, URINE: Preg Test, Ur: NEGATIVE

## 2021-02-11 SURGERY — CARPAL TUNNEL RELEASE
Anesthesia: Regional | Site: Wrist | Laterality: Right

## 2021-02-11 MED ORDER — SCOPOLAMINE 1 MG/3DAYS TD PT72: 1.0000 | MEDICATED_PATCH | TRANSDERMAL | Status: DC

## 2021-02-11 MED ORDER — CEFAZOLIN SODIUM-DEXTROSE 2-4 GM/100ML-% IV SOLN
INTRAVENOUS | Status: AC
  Filled 2021-02-11: qty 100

## 2021-02-11 MED ORDER — POVIDONE-IODINE 10 % EX SWAB: 2.0000 "application " | Freq: Once | CUTANEOUS | Status: DC

## 2021-02-11 MED ORDER — LISINOPRIL 10 MG PO TABS
10.0000 mg | ORAL_TABLET | Freq: Once | ORAL | Status: AC
  Administered 2021-02-11: 10 mg via ORAL
  Filled 2021-02-11: qty 1

## 2021-02-11 MED ORDER — LACTATED RINGERS IV SOLN: INTRAVENOUS | Status: DC

## 2021-02-11 MED ORDER — AMLODIPINE BESYLATE 5 MG PO TABS
5.0000 mg | ORAL_TABLET | Freq: Once | ORAL | Status: AC
  Administered 2021-02-11: 5 mg via ORAL
  Filled 2021-02-11: qty 1

## 2021-02-11 MED ORDER — CEFAZOLIN SODIUM-DEXTROSE 2-4 GM/100ML-% IV SOLN: 2.0000 g | INTRAVENOUS | Status: DC

## 2021-02-11 MED ORDER — ACETAMINOPHEN 500 MG PO TABS: 1000.0000 mg | ORAL_TABLET | Freq: Once | ORAL | Status: DC

## 2021-02-11 MED ORDER — POVIDONE-IODINE 7.5 % EX SOLN
Freq: Once | CUTANEOUS | Status: DC
  Filled 2021-02-11: qty 118

## 2021-02-11 NOTE — Progress Notes (Signed)
BP elevated. Meds given. No change. See flow sheets for details. Dr Jean Rosenthal cancelled surgery. Dr August Saucer aware. OR aware. Pt verbalized understanding

## 2021-02-11 NOTE — Anesthesia Preprocedure Evaluation (Deleted)
Anesthesia Evaluation  Patient identified by MRN, date of birth, ID band Patient awake    Reviewed: Allergy & Precautions, NPO status , Patient's Chart, lab work & pertinent test results  History of Anesthesia Complications Negative for: history of anesthetic complications  Airway Mallampati: II  TM Distance: >3 FB Neck ROM: Full    Dental  (+) Missing, Chipped, Dental Advisory Given   Pulmonary neg pulmonary ROS,    breath sounds clear to auscultation       Cardiovascular hypertension, Pt. on medications  Rhythm:Regular Rate:Normal     Neuro/Psych negative neurological ROS     GI/Hepatic negative GI ROS, Neg liver ROS,   Endo/Other  obese  Renal/GU negative Renal ROS     Musculoskeletal   Abdominal   Peds  Hematology negative hematology ROS (+)   Anesthesia Other Findings   Reproductive/Obstetrics                             Anesthesia Physical Anesthesia Plan  ASA: 2  Anesthesia Plan: General   Post-op Pain Management:    Induction: Intravenous  PONV Risk Score and Plan: 3 and Ondansetron, Dexamethasone and Scopolamine patch - Pre-op  Airway Management Planned: LMA  Additional Equipment: None  Intra-op Plan:   Post-operative Plan:   Informed Consent: I have reviewed the patients History and Physical, chart, labs and discussed the procedure including the risks, benefits and alternatives for the proposed anesthesia with the patient or authorized representative who has indicated his/her understanding and acceptance.     Dental advisory given  Plan Discussed with: CRNA and Surgeon  Anesthesia Plan Comments: (Pt hypertensive, with DBP 120 mmHg, did not take her antihypertensives this am.  Will give pt her BP meds and recheck BP. Will reschedule for DPB > 100 mmHg after meds given Persistent DBP >120 mmHg. Pt will f/u with her MD in [facility] for BP control and  reschedule. Procedure postponed pending BP control)       Anesthesia Quick Evaluation

## 2021-02-20 ENCOUNTER — Encounter: Payer: PRIVATE HEALTH INSURANCE | Admitting: Orthopedic Surgery

## 2021-03-07 ENCOUNTER — Other Ambulatory Visit: Payer: Self-pay

## 2021-03-08 ENCOUNTER — Ambulatory Visit: Payer: Managed Care, Other (non HMO) | Admitting: Family Medicine

## 2021-03-08 ENCOUNTER — Encounter: Payer: Self-pay | Admitting: Family Medicine

## 2021-03-08 VITALS — BP 152/96 | HR 99 | Temp 98.5°F | Resp 18 | Ht 63.0 in | Wt 186.2 lb

## 2021-03-08 DIAGNOSIS — I10 Essential (primary) hypertension: Secondary | ICD-10-CM

## 2021-03-08 MED ORDER — AMLODIPINE BESYLATE-VALSARTAN 5-320 MG PO TABS: 1.0000 | ORAL_TABLET | Freq: Every day | ORAL | 2 refills | Status: DC

## 2021-03-08 MED ORDER — CHLORTHALIDONE 25 MG PO TABS: 25.0000 mg | ORAL_TABLET | Freq: Every day | ORAL | 2 refills | Status: DC

## 2021-03-08 NOTE — Progress Notes (Signed)
Chief Complaint  Patient presents with   New Patient (Initial Visit)    Concerns/ questions: elevated BP Flu shot today: declines        New Patient Visit SUBJECTIVE: HPI: Tina Walker is an 40 y.o.female who is being seen for establishing care.  Hypertension Patient presents for hypertension follow up. She does monitor home blood pressures. Blood pressures ranging on average from 140-200's/70-100's. She is compliant with medications- Norvasc 5 mg/d, Zestoretic 10-12.5 mg/d.  Patient has these side effects of medication: none She is usually adhering to a healthy diet overall. Exercise: walking No CP or SOB.  She does snore at night, does not wake up gasping or air or feeling like she has not slept at all.   Past Medical History:  Diagnosis Date   Hypertension    Past Surgical History:  Procedure Laterality Date   CESAREAN SECTION     x 2   I & D EXTREMITY Bilateral 07/28/2020   Procedure: IRRIGATION AND DEBRIDEMENT RIGHT ANTERIOR SHOULDER AND LEFT  ANTERIOR THIGH;  Surgeon: Cammy Copa, MD;  Location: [facility] OR;  Service: Orthopedics;  Laterality: Bilateral;   SHOULDER SURGERY  07/2020   Family History  Problem Relation Age of Onset   Hypertension Mother    Stroke Mother    Hypertension Father    Asthma Father    Stroke Father    Early death Father    Seizures Brother    Cancer Neg Hx    No Known Allergies  Current Outpatient Medications:    amLODipine-valsartan (EXFORGE) 5-320 MG tablet, Take 1 tablet by mouth daily., Disp: 30 tablet, Rfl: 2   chlorthalidone (HYGROTON) 25 MG tablet, Take 1 tablet (25 mg total) by mouth daily., Disp: 30 tablet, Rfl: 2  OBJECTIVE: BP (!) 152/96 (BP Location: Left Arm, Patient Position: Sitting, Cuff Size: Large)   Pulse 99   Temp 98.5 F (36.9 C) (Oral)   Resp 18   Ht 5\' 3"  (1.6 m)   Wt 186 lb 3.2 oz (84.5 kg)   SpO2 99%   BMI 32.98 kg/m  General:  well developed, well nourished, in no apparent distress Skin:  no  significant moles, warts, or growths Lungs:  clear to auscultation, breath sounds equal bilaterally, no respiratory distress Cardio:  regular rate and rhythm, no LE edema or bruits Neuro:  gait normal Psych: well oriented with normal range of affect and appropriate judgment/insight  ASSESSMENT/PLAN: Essential hypertension - Plan: amLODipine-valsartan (EXFORGE) 5-320 MG tablet, chlorthalidone (HYGROTON) 25 MG tablet, Basic metabolic panel  Chronic, unstable. Change Zestoretic/Amolodipine to chlorthalidone 25 mg/d and Exforge 5-320 mg/d Cont to monitor sugars. Counseled on diet/exercise. Ck BMP today.  Declines covid vaccine booster and flu shot.  Patient should return in 2 weeks for CPE where we will recheck labs. The patient voiced understanding and agreement to the plan.   [place], DO 03/08/21  9:52 AM

## 2021-03-08 NOTE — Patient Instructions (Addendum)
Keep the diet clean and stay active.  Give [place] 2-3 business days to get the results of your labs back.   Don't get pregnant on these medications.   Call Center for [facility] at [facility] at [phone] for an appointment.  They are located at [address], [place], [place], [address] (right across the hall from our office).  Let us know if you need anything.

## 2021-04-16 ENCOUNTER — Other Ambulatory Visit: Payer: Self-pay

## 2021-04-16 ENCOUNTER — Encounter: Payer: Self-pay | Admitting: General Practice

## 2021-04-16 ENCOUNTER — Encounter (HOSPITAL_COMMUNITY): Payer: Self-pay | Admitting: Orthopedic Surgery

## 2021-04-16 ENCOUNTER — Encounter: Payer: Managed Care, Other (non HMO) | Admitting: Family Medicine

## 2021-04-16 NOTE — Progress Notes (Signed)
PCP - Dr. Carmelia Roller Cardiologist - denies EKG - 02/07/21 Chest x-ray -  ECHO -  Cardiac Cath -  CPAP -   ERAS Protcol - clears until 0415  COVID TEST- n/a  Anesthesia review: n/a  -------------  SDW INSTRUCTIONS:  Your procedure is scheduled on Thursday 12/1. Please report to [facility] Main Entrance "A" at 0530 A.M., and check in at the Admitting office. Call this number if you have problems the morning of surgery: [phone]   Remember: Do not eat after midnight the night before your surgery  Clear liquids until 04:15 AM morning of surgery.    Medications to take morning of surgery with a sip of water include: NONE  As of today, STOP taking any Aspirin (unless otherwise instructed by your surgeon), Aleve, Naproxen, Ibuprofen, Motrin, Advil, Goody's, BC's, all herbal medications, fish oil, and all vitamins.    The Morning of Surgery Do not wear jewelry, make-up or nail polish. Do not wear lotions, powders, or perfumes or deodorant  Do not bring valuables to the hospital. [facility] is not responsible for any belongings or valuables.  If you are a smoker, DO NOT Smoke 24 hours prior to surgery  If you wear a CPAP at night please bring your mask the morning of surgery   Remember that you must have someone to transport you home after your surgery, and remain with you for 24 hours if you are discharged the same day.  Please bring cases for contacts, glasses, hearing aids, dentures or bridgework because it cannot be worn into surgery.   Patients discharged the day of surgery will not be allowed to drive home.   Please shower the NIGHT BEFORE/MORNING OF SURGERY (use antibacterial soap like DIAL soap if possible). Wear comfortable clothes the morning of surgery. Oral Hygiene is also important to reduce your risk of infection.  Remember - BRUSH YOUR TEETH THE MORNING OF SURGERY WITH YOUR REGULAR TOOTHPASTE  Patient denies shortness of breath, fever, cough and chest pain.

## 2021-04-17 NOTE — Anesthesia Preprocedure Evaluation (Addendum)
Anesthesia Evaluation  Patient identified by MRN, date of birth, ID band Patient awake    Reviewed: Allergy & Precautions, NPO status , Patient's Chart, lab work & pertinent test results  Airway Mallampati: I  TM Distance: >3 FB Neck ROM: Full    Dental no notable dental hx. (+) Teeth Intact, Dental Advisory Given,    Pulmonary neg pulmonary ROS,    Pulmonary exam normal breath sounds clear to auscultation       Cardiovascular hypertension, Pt. on medications Normal cardiovascular exam Rhythm:Regular Rate:Normal     Neuro/Psych negative psych ROS   GI/Hepatic negative GI ROS, Neg liver ROS,   Endo/Other  negative endocrine ROS  Renal/GU negative Renal ROS     Musculoskeletal   Abdominal (+) + obese (BMI 30.71),   Peds  Hematology   Anesthesia Other Findings   Reproductive/Obstetrics                           Anesthesia Physical Anesthesia Plan  ASA: 2  Anesthesia Plan: Bier Block and MAC and Bier Block-LIDOCAINE ONLY   Post-op Pain Management: Minimal or no pain anticipated and Regional block   Induction:   PONV Risk Score and Plan: 2 and Treatment may vary due to age or medical condition, Midazolam and Ondansetron  Airway Management Planned: Natural Airway and Simple Face Mask  Additional Equipment: None  Intra-op Plan:   Post-operative Plan:   Informed Consent: I have reviewed the patients History and Physical, chart, labs and discussed the procedure including the risks, benefits and alternatives for the proposed anesthesia with the patient or authorized representative who has indicated his/her understanding and acceptance.     Dental advisory given  Plan Discussed with: CRNA  Anesthesia Plan Comments: (R Bier block)       Anesthesia Quick Evaluation

## 2021-04-18 ENCOUNTER — Other Ambulatory Visit: Payer: Self-pay

## 2021-04-18 ENCOUNTER — Encounter (HOSPITAL_COMMUNITY)
Admission: RE | Disposition: A | Discharge status: Home / Self Care | Payer: Self-pay | Source: Home / Self Care | Attending: Orthopedic Surgery

## 2021-04-18 ENCOUNTER — Ambulatory Visit (HOSPITAL_COMMUNITY)
Admission: RE | Admit: 2021-04-18 | Discharge: 2021-04-18 | Disposition: A | Discharge status: Home / Self Care | Payer: Managed Care, Other (non HMO) | Attending: Orthopedic Surgery | Admitting: Orthopedic Surgery

## 2021-04-18 ENCOUNTER — Ambulatory Visit (HOSPITAL_COMMUNITY): Payer: Managed Care, Other (non HMO) | Admitting: Anesthesiology

## 2021-04-18 ENCOUNTER — Encounter (HOSPITAL_COMMUNITY): Payer: Self-pay | Admitting: Orthopedic Surgery

## 2021-04-18 DIAGNOSIS — Z01818 Encounter for other preprocedural examination: Secondary | ICD-10-CM

## 2021-04-18 DIAGNOSIS — G5601 Carpal tunnel syndrome, right upper limb: Secondary | ICD-10-CM | POA: Insufficient documentation

## 2021-04-18 HISTORY — PX: CARPAL TUNNEL RELEASE: SHX101

## 2021-04-18 LAB — CBC
HCT: 35 % — ABNORMAL LOW (ref 36.0–46.0)
Hemoglobin: 11.3 g/dL — ABNORMAL LOW (ref 12.0–15.0)
MCH: 25.1 pg — ABNORMAL LOW (ref 26.0–34.0)
MCHC: 32.3 g/dL (ref 30.0–36.0)
MCV: 77.6 fL — ABNORMAL LOW (ref 80.0–100.0)
Platelets: 287 10*3/uL (ref 150–400)
RBC: 4.51 MIL/uL (ref 3.87–5.11)
RDW: 17.3 % — ABNORMAL HIGH (ref 11.5–15.5)
WBC: 8 10*3/uL (ref 4.0–10.5)
nRBC: 0 % (ref 0.0–0.2)

## 2021-04-18 LAB — BASIC METABOLIC PANEL
Anion gap: 9 (ref 5–15)
BUN: 14 mg/dL (ref 6–20)
CO2: 25 mmol/L (ref 22–32)
Calcium: 9.9 mg/dL (ref 8.9–10.3)
Chloride: 104 mmol/L (ref 98–111)
Creatinine, Ser: 1.19 mg/dL — ABNORMAL HIGH (ref 0.44–1.00)
GFR, Estimated: >60 mL/min (ref >60)
Glucose, Bld: 99 mg/dL (ref 70–99)
Potassium: 3.7 mmol/L (ref 3.5–5.1)
Sodium: 138 mmol/L (ref 135–145)

## 2021-04-18 LAB — POCT PREGNANCY, URINE: Preg Test, Ur: NEGATIVE

## 2021-04-18 SURGERY — CARPAL TUNNEL RELEASE
Anesthesia: Monitor Anesthesia Care | Site: Hand | Laterality: Right

## 2021-04-18 MED ORDER — CHLORHEXIDINE GLUCONATE 0.12 % MT SOLN: 15.0000 mL | Freq: Once | OROMUCOSAL | Status: AC

## 2021-04-18 MED ORDER — POVIDONE-IODINE 7.5 % EX SOLN
Freq: Once | CUTANEOUS | Status: DC
  Filled 2021-04-18: qty 118

## 2021-04-18 MED ORDER — PROPOFOL 10 MG/ML IV BOLUS
INTRAVENOUS | Status: DC | PRN
  Administered 2021-04-18 (×3): 25 mg via INTRAVENOUS

## 2021-04-18 MED ORDER — POVIDONE-IODINE 10 % EX SWAB
2.0000 "application " | Freq: Once | CUTANEOUS | Status: AC
  Administered 2021-04-18: 2 via TOPICAL

## 2021-04-18 MED ORDER — CEFAZOLIN SODIUM-DEXTROSE 2-4 GM/100ML-% IV SOLN
2.0000 g | INTRAVENOUS | Status: AC
  Administered 2021-04-18: 2 g via INTRAVENOUS
  Filled 2021-04-18: qty 100

## 2021-04-18 MED ORDER — MIDAZOLAM HCL 2 MG/2ML IJ SOLN
INTRAMUSCULAR | Status: AC
  Filled 2021-04-18: qty 2

## 2021-04-18 MED ORDER — LACTATED RINGERS IV SOLN: INTRAVENOUS | Status: DC

## 2021-04-18 MED ORDER — TRAMADOL HCL 50 MG PO TABS: 50.0000 mg | ORAL_TABLET | Freq: Four times a day (QID) | ORAL | 0 refills | Status: DC | PRN

## 2021-04-18 MED ORDER — DEXMEDETOMIDINE (PRECEDEX) IN NS 20 MCG/5ML (4 MCG/ML) IV SYRINGE
PREFILLED_SYRINGE | INTRAVENOUS | Status: DC | PRN
  Administered 2021-04-18: 8 ug via INTRAVENOUS

## 2021-04-18 MED ORDER — PROPOFOL 500 MG/50ML IV EMUL
INTRAVENOUS | Status: DC | PRN
  Administered 2021-04-18: 50 ug/kg/min via INTRAVENOUS

## 2021-04-18 MED ORDER — FENTANYL CITRATE (PF) 250 MCG/5ML IJ SOLN
INTRAMUSCULAR | Status: DC | PRN
  Administered 2021-04-18: 50 ug via INTRAVENOUS

## 2021-04-18 MED ORDER — MIDAZOLAM HCL 2 MG/2ML IJ SOLN
INTRAMUSCULAR | Status: DC | PRN
  Administered 2021-04-18: 2 mg via INTRAVENOUS

## 2021-04-18 MED ORDER — FENTANYL CITRATE (PF) 250 MCG/5ML IJ SOLN
INTRAMUSCULAR | Status: AC
  Filled 2021-04-18: qty 5

## 2021-04-18 MED ORDER — FENTANYL CITRATE (PF) 100 MCG/2ML IJ SOLN: 25.0000 ug | INTRAMUSCULAR | Status: DC | PRN

## 2021-04-18 MED ORDER — ONDANSETRON HCL 4 MG/2ML IJ SOLN: 4.0000 mg | Freq: Once | INTRAMUSCULAR | Status: DC | PRN

## 2021-04-18 MED ORDER — ORAL CARE MOUTH RINSE: 15.0000 mL | Freq: Once | OROMUCOSAL | Status: AC

## 2021-04-18 MED ORDER — BUPIVACAINE HCL (PF) 0.25 % IJ SOLN
INTRAMUSCULAR | Status: DC | PRN
  Administered 2021-04-18: 20 mL

## 2021-04-18 MED ORDER — ACETAMINOPHEN 10 MG/ML IV SOLN: 1000.0000 mg | Freq: Once | INTRAVENOUS | Status: DC | PRN

## 2021-04-18 MED ORDER — ONDANSETRON HCL 4 MG/2ML IJ SOLN
INTRAMUSCULAR | Status: DC | PRN
  Administered 2021-04-18: 4 mg via INTRAVENOUS

## 2021-04-18 MED ORDER — BUPIVACAINE HCL (PF) 0.25 % IJ SOLN
INTRAMUSCULAR | Status: AC
  Filled 2021-04-18: qty 30

## 2021-04-18 MED ORDER — CHLORHEXIDINE GLUCONATE 0.12 % MT SOLN
OROMUCOSAL | Status: AC
  Administered 2021-04-18: 15 mL via OROMUCOSAL
  Filled 2021-04-18: qty 15

## 2021-04-18 MED ORDER — 0.9 % SODIUM CHLORIDE (POUR BTL) OPTIME
TOPICAL | Status: DC | PRN
  Administered 2021-04-18: 1000 mL

## 2021-04-18 MED ORDER — LIDOCAINE HCL (PF) 0.5 % IJ SOLN
INTRAMUSCULAR | Status: DC | PRN
  Administered 2021-04-18: 40 mL via INTRAVENOUS

## 2021-04-18 SURGICAL SUPPLY — 46 items
BAG COUNTER SPONGE SURGICOUNT (BAG) ×2 IMPLANT
BNDG ELASTIC 3X5.8 VLCR STR LF (GAUZE/BANDAGES/DRESSINGS) ×2 IMPLANT
BNDG ELASTIC 4X5.8 VLCR STR LF (GAUZE/BANDAGES/DRESSINGS) ×2 IMPLANT
BNDG ESMARK 4X9 LF (GAUZE/BANDAGES/DRESSINGS) IMPLANT
BNDG GAUZE ELAST 4 BULKY (GAUZE/BANDAGES/DRESSINGS) ×2 IMPLANT
CORD BIPOLAR FORCEPS 12FT (ELECTRODE) ×2 IMPLANT
COVER SURGICAL LIGHT HANDLE (MISCELLANEOUS) ×2 IMPLANT
CUFF TOURN SGL QUICK 18X4 (TOURNIQUET CUFF) ×2 IMPLANT
CUFF TOURN SGL QUICK 24 (TOURNIQUET CUFF)
CUFF TRNQT CYL 24X4X16.5-23 (TOURNIQUET CUFF) IMPLANT
DRAPE SURG 17X23 STRL (DRAPES) ×2 IMPLANT
DURAPREP 26ML APPLICATOR (WOUND CARE) ×2 IMPLANT
GAUZE SPONGE 4X4 12PLY STRL (GAUZE/BANDAGES/DRESSINGS) ×2 IMPLANT
GAUZE XEROFORM 1X8 LF (GAUZE/BANDAGES/DRESSINGS) ×2 IMPLANT
GLOVE SRG 8 PF TXTR STRL LF DI (GLOVE) ×1 IMPLANT
GLOVE SURG LTX SZ8 (GLOVE) ×2 IMPLANT
GLOVE SURG UNDER POLY LF SZ8 (GLOVE) ×1
GOWN STRL REUS W/ TWL LRG LVL3 (GOWN DISPOSABLE) ×2 IMPLANT
GOWN STRL REUS W/ TWL XL LVL3 (GOWN DISPOSABLE) ×1 IMPLANT
GOWN STRL REUS W/TWL LRG LVL3 (GOWN DISPOSABLE) ×2
GOWN STRL REUS W/TWL XL LVL3 (GOWN DISPOSABLE) ×1
KIT BASIN OR (CUSTOM PROCEDURE TRAY) ×2 IMPLANT
KIT TURNOVER KIT B (KITS) ×2 IMPLANT
LOOP VESSEL MAXI BLUE (MISCELLANEOUS) IMPLANT
NEEDLE HYPO 25GX1X1/2 BEV (NEEDLE) IMPLANT
NS IRRIG 1000ML POUR BTL (IV SOLUTION) ×2 IMPLANT
PACK ORTHO EXTREMITY (CUSTOM PROCEDURE TRAY) ×2 IMPLANT
PAD ABD 8X10 STRL (GAUZE/BANDAGES/DRESSINGS) ×2 IMPLANT
PAD ARMBOARD 7.5X6 YLW CONV (MISCELLANEOUS) ×4 IMPLANT
PAD CAST 3X4 CTTN HI CHSV (CAST SUPPLIES) ×1 IMPLANT
PAD CAST 4YDX4 CTTN HI CHSV (CAST SUPPLIES) ×1 IMPLANT
PADDING CAST COTTON 3X4 STRL (CAST SUPPLIES) ×1
PADDING CAST COTTON 4X4 STRL (CAST SUPPLIES) ×1
SUCTION FRAZIER HANDLE 10FR (MISCELLANEOUS)
SUCTION TUBE FRAZIER 10FR DISP (MISCELLANEOUS) IMPLANT
SUT ETHILON 3 0 PS 1 (SUTURE) ×4 IMPLANT
SUT VIC AB 2-0 CT1 27 (SUTURE)
SUT VIC AB 2-0 CT1 TAPERPNT 27 (SUTURE) IMPLANT
SUT VIC AB 3-0 FS2 27 (SUTURE) IMPLANT
SYR CONTROL 10ML LL (SYRINGE) IMPLANT
SYSTEM CHEST DRAIN TLS 7FR (DRAIN) IMPLANT
TOWEL GREEN STERILE (TOWEL DISPOSABLE) ×2 IMPLANT
TOWEL GREEN STERILE FF (TOWEL DISPOSABLE) ×2 IMPLANT
TUBE CONNECTING 12X1/4 (SUCTIONS) IMPLANT
UNDERPAD 30X36 HEAVY ABSORB (UNDERPADS AND DIAPERS) ×2 IMPLANT
WATER STERILE IRR 1000ML POUR (IV SOLUTION) IMPLANT

## 2021-04-18 NOTE — Anesthesia Procedure Notes (Signed)
Procedure Name: MAC Date/Time: 04/18/2021 8:03 AM Performed by: Imagene Riches, CRNA Pre-anesthesia Checklist: Patient identified, Emergency Drugs available, Suction available, Patient being monitored and Timeout performed Patient Re-evaluated:Patient Re-evaluated prior to induction Oxygen Delivery Method: Nasal cannula

## 2021-04-18 NOTE — Transfer of Care (Signed)
Immediate Anesthesia Transfer of Care Note  Patient: Tina Walker  Procedure(s) Performed: RIGHT CARPAL TUNNEL RELEASE (Right: Hand)  Patient Location: PACU  Anesthesia Type:MAC and Bier block  Level of Consciousness: awake  Airway & Oxygen Therapy: Patient Spontanous Breathing  Post-op Assessment: Report given to RN and Post -op Vital signs reviewed and stable  Post vital signs: Reviewed and stable  Last Vitals:  Vitals Value Taken Time  BP 132/80 04/18/21 0830  Temp    Pulse 77 04/18/21 0830  Resp 15 04/18/21 0830  SpO2 100 % 04/18/21 0830  Vitals shown include unvalidated device data.  Last Pain:  Vitals:   04/18/21 0633  TempSrc:   PainSc: 0-No pain         Complications: No notable events documented.

## 2021-04-18 NOTE — Anesthesia Postprocedure Evaluation (Signed)
Anesthesia Post Note  Patient: Tina Walker  Procedure(s) Performed: RIGHT CARPAL TUNNEL RELEASE (Right: Hand)     Patient location during evaluation: PACU Anesthesia Type: MAC and Bier Block Level of consciousness: awake and alert Pain management: pain level controlled Vital Signs Assessment: post-procedure vital signs reviewed and stable Respiratory status: spontaneous breathing, nonlabored ventilation, respiratory function stable and patient connected to nasal cannula oxygen Cardiovascular status: stable and blood pressure returned to baseline Postop Assessment: no apparent nausea or vomiting Anesthetic complications: no   No notable events documented.  Last Vitals:  Vitals:   04/18/21 0830 04/18/21 0845  BP: 132/80 (!) 149/89  Pulse: 77 75  Resp: 15 19  Temp: 36.4 C 36.4 C  SpO2: 100% 100%    Last Pain:  Vitals:   04/18/21 0845  TempSrc:   PainSc: 0-No pain                 Barnet Glasgow

## 2021-04-18 NOTE — Brief Op Note (Signed)
   04/18/2021  8:33 AM  PATIENT:  Tina Walker  40 y.o. female  PRE-OPERATIVE DIAGNOSIS:  RIGHT CARPAL TUNNEL SYNDROME  POST-OPERATIVE DIAGNOSIS:  RIGHT CARPAL TUNNEL SYNDROME  PROCEDURE:  Procedure(s): RIGHT CARPAL TUNNEL RELEASE  SURGEON:  Surgeon(s): August Saucer, Corrie Mckusick, MD  ASSISTANT: magnant pa  ANESTHESIA:   iv regional  EBL: 3 ml    Total I/O In: 600 [I.V.:600] Out: 2 [Blood:2]  BLOOD ADMINISTERED: none  DRAINS: none   LOCAL MEDICATIONS USED:  marcaine  SPECIMEN:  No Specimen  COUNTS:  YES  TOURNIQUET:   Total Tourniquet Time Documented: Upper Arm (Right) - 28 minutes Total: Upper Arm (Right) - 28 minutes   DICTATION: .Other Dictation: Dictation Number [id number]  PLAN OF CARE: Discharge to home after PACU  PATIENT DISPOSITION:  PACU - hemodynamically stable

## 2021-04-18 NOTE — H&P (Signed)
Tina Walker is an 40 y.o. female.   Chief Complaint: Right hand pain and numbness HPI: Tina Walker is a 40 year old patient with relatively long history of right arm and hand numbness and tingling.  Bothers her on a daily basis.  She describes some loss of dexterity in the hand as well as numbness and tingling.  She has had an EMG nerve study which shows moderate carpal tunnel syndrome.  She has failed conservative measures and presents for definitive operative management of her carpal tunnel syndrome.  Past Medical History:  Diagnosis Date   Hypertension     Past Surgical History:  Procedure Laterality Date   CESAREAN SECTION     x 2   I & D EXTREMITY Bilateral 07/28/2020   Procedure: IRRIGATION AND DEBRIDEMENT RIGHT ANTERIOR SHOULDER AND LEFT  ANTERIOR THIGH;  Surgeon: Cammy Copa, MD;  Location: [facility] OR;  Service: Orthopedics;  Laterality: Bilateral;   SHOULDER SURGERY  07/2020    Family History  Problem Relation Age of Onset   Hypertension Mother    Stroke Mother    Hypertension Father    Asthma Father    Stroke Father    Early death Father    Seizures Brother    Cancer Neg Hx    Social History:  reports that she has never smoked. She has never used smokeless tobacco. She reports that she does not drink alcohol and does not use drugs.  Allergies: No Known Allergies  Medications Prior to Admission  Medication Sig Dispense Refill   amLODipine-valsartan (EXFORGE) 5-320 MG tablet Take 1 tablet by mouth daily. 30 tablet 2   chlorthalidone (HYGROTON) 25 MG tablet Take 1 tablet (25 mg total) by mouth daily. 30 tablet 2    No results found for this or any previous visit (from the past 48 hour(s)). No results found.  Review of Systems  Musculoskeletal:  Positive for arthralgias.  All other systems reviewed and are negative.  Blood pressure (!) 134/94, pulse 95, temperature 98.7 F (37.1 C), temperature source Oral, resp. rate 20, height 5\' 4"  (1.626 m), weight 81.2 kg,  last menstrual period 04/02/2021, SpO2 100 %. Physical Exam Vitals reviewed.  HENT:     Head: Normocephalic.     Nose: Nose normal.  Eyes:     Pupils: Pupils are equal, round, and reactive to light.  Cardiovascular:     Rate and Rhythm: Normal rate.     Pulses: Normal pulses.  Pulmonary:     Effort: Pulmonary effort is normal.  Abdominal:     General: Abdomen is flat.  Musculoskeletal:     Cervical back: Normal range of motion.  Skin:    General: Skin is warm.     Capillary Refill: Capillary refill takes less than 2 seconds.  Neurological:     General: No focal deficit present.     Mental Status: She is alert.  Psychiatric:        Mood and Affect: Mood normal.    Ortho exam demonstrates decreased sensation throughout the palm.  Intact sensation to all 5 fingertips on the volar and dorsal aspects.  2+ radial pulse of the right upper extremity.  Positive Phalen sign.  Positive Tinel sign.  No subluxing ulnar nerve noted.  No pain with passive motion of the right shoulder.  No pain with active range of motion of the cervical spine.  Negative Spurling sign.  Intact EPL, wrist extension, finger abduction, finger adduction.   Assessment/Plan Impression is right carpal tunnel syndrome.  Was having some shoulder symptoms earlier but that has resolved.  EMG nerve study consistent with moderate carpal tunnel syndrome.  She is having daily pain and numbness interfering with activities of daily living along with symptoms unrelieved by conservative measures.  The risk and benefits of carpal tunnel release are discussed with the patient including but not limited to infection nerve vessel damage hand stiffness decreased grip strength as well as delay in return to full function and incomplete symptom relief.  Patient understands the risk and benefits and wishes to proceed.  All questions answered  Burnard Bunting, MD 04/18/2021, 6:51 AM

## 2021-04-18 NOTE — Anesthesia Procedure Notes (Signed)
Anesthesia Regional Block: Bier block (IV Regional)   Pre-Anesthetic Checklist: , timeout performed,  Correct Patient, Correct Site, Correct Laterality,  Correct Procedure, Correct Position, site marked,  Risks and benefits discussed,  Surgical consent,  At surgeon's request  Laterality: Right  Prep: chloraprep       Needles:  Injection technique: Single-shot      Additional Needles:   Procedures:,,,,,, Esmarch exsanguination,  Single tourniquet utilized    Narrative:  Start time: 04/18/2021 7:43 AM End time: 04/18/2021 7:51 AM  Performed by: Personally  Anesthesiologist: Trevor Iha, MD

## 2021-04-19 ENCOUNTER — Encounter (HOSPITAL_COMMUNITY): Payer: Self-pay | Admitting: Orthopedic Surgery

## 2021-04-19 NOTE — Op Note (Signed)
NAME: Tina Walker, Tina Walker MEDICAL RECORD NO: [id number] ACCOUNT NO: [id number] DATE OF BIRTH: [birth year] FACILITY: [facility] LOCATION: [facility] PHYSICIAN: Graylin Shiver. August Saucer, MD  Operative Report   DATE OF PROCEDURE: 04/18/2021  PREOPERATIVE DIAGNOSIS:  Right carpal tunnel syndrome.  POSTOPERATIVE DIAGNOSIS:  Right carpal tunnel syndrome.  PROCEDURE:  Right carpal tunnel release.  SURGEON:  Attending Cammy Copa, MD  ASSISTANT:  Karenann Cai, PA  INDICATIONS:  The patient is a 40 year old patient with right carpal tunnel syndrome, who presents for operative management after explanation of risks and benefits.  DESCRIPTION OF PROCEDURE:  The patient was brought to the operating room where IV regional anesthetic was induced.  Preoperative antibiotics administered.  Timeout was called.  Right hand pre-scrubbed with alcohol and Betadine and allowed to air dry,  prepped with DuraPrep solution and draped in a sterile manner.  IV regional anesthetic had been induced.  Incision made at the intersection of Kaplan's cardinal line and the radial border of the fourth finger down to the proximal wrist flexion crease.   Skin and subcutaneous tissue were sharply divided.  Bleeding points present were controlled with bipolar electrocautery.  Palmar fascia was divided.  Transverse carpal ligament was visualized.  Under direct visualization, the mid portion of the  transverse carpal ligament was divided along its mid section for 2-3 mm.  Right angle retractor was then placed to lift the transverse carpal ligament off the median nerve.  The transverse carpal ligament was then divided under direct visualization  distally along its entire length.  Proximally sewell retractor was placed and lifted soft tissue up so that the transverse carpal ligament could be released under direct visualization with a Freer elevator placed between it and the median nerve down to the  forearm fascia.  Complete release was confirmed.   Motor branch intact.  Thorough irrigation was performed.  No cyst or masses within the carpal canal.  Thorough irrigation was performed and the skin edges were anesthetized using Marcaine without  epinephrine.  Tourniquet was released, bleeding points encountered were controlled using bipolar electrocautery.  Skin closed using simple 3-0 nylon sutures.  Bulky dressing plus volar wrist splint was applied.  The patient tolerated the procedure well  without immediate complications, was transferred to the recovery room in stable condition.  Luke's assistance was required at all times for retraction, opening, closing, mobilization of tissue.  His assistance was a medical necessity.   SHW D: 04/18/2021 8:37:00 am T: 04/18/2021 9:03:00 am  JOB: [id number]/ [id number]

## 2021-04-22 ENCOUNTER — Telehealth: Payer: Self-pay | Admitting: Orthopedic Surgery

## 2021-04-22 NOTE — Telephone Encounter (Signed)
Yeah that is okay by me

## 2021-04-22 NOTE — Telephone Encounter (Signed)
Patient requesting note to be written out of work for the remainder of the week. Patient tried to go back to work today and experienced a lot of pain.

## 2021-04-22 NOTE — Telephone Encounter (Signed)
I called patient and advised. She pulled note from My Chart.

## 2021-04-22 NOTE — Telephone Encounter (Signed)
Note written. Need to notify pt

## 2021-04-23 ENCOUNTER — Encounter: Payer: Managed Care, Other (non HMO) | Admitting: Advanced Practice Midwife

## 2021-04-23 NOTE — Telephone Encounter (Signed)
Spoke with patient yesterday. She pulled note from My Chart.

## 2021-04-25 ENCOUNTER — Ambulatory Visit: Payer: Managed Care, Other (non HMO)

## 2021-04-26 ENCOUNTER — Other Ambulatory Visit: Payer: Self-pay

## 2021-04-26 ENCOUNTER — Ambulatory Visit (INDEPENDENT_AMBULATORY_CARE_PROVIDER_SITE_OTHER): Payer: Managed Care, Other (non HMO) | Admitting: Surgical

## 2021-04-26 DIAGNOSIS — G5601 Carpal tunnel syndrome, right upper limb: Secondary | ICD-10-CM

## 2021-04-28 ENCOUNTER — Encounter: Payer: Self-pay | Admitting: Orthopedic Surgery

## 2021-04-28 NOTE — Progress Notes (Signed)
   Post-Op Visit Note   Patient: Tina Walker           Date of Birth: May 15, 1981           MRN: [id number] Visit Date: 04/26/2021 PCP: Sharlene Dory, DO   Assessment & Plan:  Chief Complaint:  Chief Complaint  Patient presents with   Right Wrist - Routine Post Op    S/p right CTR   Visit Diagnoses:  1. Carpal tunnel syndrome, right upper limb     Plan: Patient is a 40 year old female who presents s/p right carpal tunnel release on 04/18/2021.  She reports that she is doing well.  Preoperative numbness and tingling is all but resolved.  She states that she has no significant pain and her main complaint is itchiness around where the splint was on.  Incision is healing well with no evidence of infection or dehiscence.  She denies any fevers, chills, night sweats, drainage.  Plan is no lifting with the operative hand and return in 1 week for nurse visit for suture removal.  Follow-up 4 weeks for clinical recheck with Dr. August Saucer.  Follow-Up Instructions: No follow-ups on file.   Orders:  No orders of the defined types were placed in this encounter.  No orders of the defined types were placed in this encounter.   Imaging: No results found.  PMFS History: Patient Active Problem List   Diagnosis Date Noted   Open wound of right upper arm due to dog bite    Dog bite of left thigh without complication    Dog bite 07/29/2020   Essential hypertension    Status post surgery 07/28/2020   Past Medical History:  Diagnosis Date   Hypertension     Family History  Problem Relation Age of Onset   Hypertension Mother    Stroke Mother    Hypertension Father    Asthma Father    Stroke Father    Early death Father    Seizures Brother    Cancer Neg Hx     Past Surgical History:  Procedure Laterality Date   CARPAL TUNNEL RELEASE Right 04/18/2021   Procedure: RIGHT CARPAL TUNNEL RELEASE;  Surgeon: Cammy Copa, MD;  Location: [facility] OR;  Service: Orthopedics;  Laterality:  Right;   CESAREAN SECTION     x 2   I & D EXTREMITY Bilateral 07/28/2020   Procedure: IRRIGATION AND DEBRIDEMENT RIGHT ANTERIOR SHOULDER AND LEFT  ANTERIOR THIGH;  Surgeon: Cammy Copa, MD;  Location: [facility] OR;  Service: Orthopedics;  Laterality: Bilateral;   SHOULDER SURGERY  07/2020   Social History   Occupational History   Not on file  Tobacco Use   Smoking status: Never   Smokeless tobacco: Never  Vaping Use   Vaping Use: Never used  Substance and Sexual Activity   Alcohol use: No   Drug use: No   Sexual activity: Yes    Partners: Male

## 2021-04-30 ENCOUNTER — Telehealth: Payer: Self-pay | Admitting: Family Medicine

## 2021-04-30 NOTE — Telephone Encounter (Signed)
Pt called in and stated her BP was low 88/72. She stated she feels like she could pass out. Transferred her to triage for further advice.

## 2021-04-30 NOTE — Telephone Encounter (Signed)
Called the patient and she has spoken to 3 people already. Triage had told her to lay on the floor with her feet up to see if this would bring her BP up, which is what she was doing when I called her. They told her if this did not help to call 911. The patient stated she was already feeling better. But would do as told//or have someone take her to the ER.

## 2021-05-01 NOTE — Telephone Encounter (Signed)
Patient informed of PCP instructions. She is feeling much better today Has appt scheduled with PCP on 05/06/21

## 2021-05-01 NOTE — Telephone Encounter (Signed)
After hours call: Pt returning call.   Telephone: [phone]  Transaction Date/Time: 05/01/2021 7:17:55 AM (ET)

## 2021-05-01 NOTE — Telephone Encounter (Signed)
Nurse Assessment Nurse: Rennis Petty, RN, [name] Date/Time Tina Walker Time): 04/30/2021 5:04:34 PM Confirm and document reason for call. If symptomatic, describe symptoms. ---Caller's BP 88/72 and mentions her BP Rx. Weak, dizzy. Mentions appt next week. Takes Amlodipine, Valsartan and another. BP typically is 120/80 last two months since med change. Does the patient have any new or worsening symptoms? ---Yes Will a triage be completed? ---Yes Related visit to physician within the last 2 weeks? ---No Does the PT have any chronic conditions? (i.e. diabetes, asthma, this includes High risk factors for pregnancy, etc.) ---Yes List chronic conditions. ---htn Is the patient pregnant or possibly pregnant? (Ask all females between the ages of 53-55) ---No Is this a behavioral health or substance abuse call? ---No Guidelines Guideline Title Affirmed Question Affirmed Notes Nurse Date/Time (Eastern Time) Blood Pressure - Low [1] Systolic BP < 90 AND [2] dizzy, lightheaded, or weak Rennis Petty, RN, [name] 04/30/2021 5:06:42 PM Disp. Time Tina Walker Time) Disposition Final User 04/30/2021 5:03:02 PM Send to Urgent Queue [place], Erin PLEASE NOTE: All timestamps contained within this report are represented as [place] Standard Time. CONFIDENTIALTY NOTICE: This fax transmission is intended only for the addressee. It contains information that is legally privileged, confidential or otherwise protected from use or disclosure. If you are not the intended recipient, you are strictly prohibited from reviewing, disclosing, copying using or disseminating any of this information or taking any action in reliance on or regarding this information. If you have received this fax in error, please notify us immediately by telephone so that we can arrange for its return to [place]. Phone: [phone], Toll-Free: [phone], Fax: [phone] Page: 2 of 2 Call Id: [id number] Disp. Time Tina Walker Time) Disposition Final  User 04/30/2021 5:15:37 PM 911 Outcome Documentation Rennis Petty, RN, [name] Reason: Called back to check on caller and phone went to voice mail. Left msg without any phi. 04/30/2021 5:08:17 PM Call EMS 911 Now Yes Rennis Petty, RN, Pablo Ledger Disagree/Comply Comply Caller Understands Yes PreDisposition InappropriateToAsk Care Advice Given Per Guideline CALL EMS 911 NOW: * Immediate medical attention is needed. You need to hang up and call 911 (or an ambulance). FIRST AID - LIE DOWN FOR SHOCK: * Lie down with feet elevated. * Reason: Treatment for shock. CARE ADVICE given per Low Blood Pressure (Adult) guideline

## 2021-05-01 NOTE — Telephone Encounter (Signed)
Stop the chlorthalidone if her BP is that low plz. Ty.

## 2021-05-01 NOTE — Telephone Encounter (Signed)
Called left message to call back 

## 2021-05-02 ENCOUNTER — Other Ambulatory Visit: Payer: Self-pay

## 2021-05-02 ENCOUNTER — Ambulatory Visit (INDEPENDENT_AMBULATORY_CARE_PROVIDER_SITE_OTHER): Payer: Managed Care, Other (non HMO)

## 2021-05-02 DIAGNOSIS — Z9889 Other specified postprocedural states: Secondary | ICD-10-CM

## 2021-05-02 NOTE — Progress Notes (Signed)
Patient came in today for a suture removal. Incision looked good with no redness or drainage.Sutures were removed and steri strips applied.

## 2021-05-06 ENCOUNTER — Encounter: Payer: Self-pay | Admitting: Family Medicine

## 2021-05-06 ENCOUNTER — Ambulatory Visit (INDEPENDENT_AMBULATORY_CARE_PROVIDER_SITE_OTHER): Payer: Managed Care, Other (non HMO) | Admitting: Family Medicine

## 2021-05-06 VITALS — BP 112/72 | HR 90 | Temp 98.1°F | Ht 63.0 in | Wt 175.1 lb

## 2021-05-06 DIAGNOSIS — Z0000 Encounter for general adult medical examination without abnormal findings: Secondary | ICD-10-CM | POA: Diagnosis not present

## 2021-05-06 DIAGNOSIS — Z1159 Encounter for screening for other viral diseases: Secondary | ICD-10-CM

## 2021-05-06 LAB — CBC
HCT: 33 % — ABNORMAL LOW (ref 36.0–46.0)
Hemoglobin: 10.8 g/dL — ABNORMAL LOW (ref 12.0–15.0)
MCHC: 32.7 g/dL (ref 30.0–36.0)
MCV: 77.1 fl — ABNORMAL LOW (ref 78.0–100.0)
Platelets: 230 10*3/uL (ref 150.0–400.0)
RBC: 4.29 Mil/uL (ref 3.87–5.11)
RDW: 17.9 % — ABNORMAL HIGH (ref 11.5–15.5)
WBC: 6.6 10*3/uL (ref 4.0–10.5)

## 2021-05-06 LAB — COMPREHENSIVE METABOLIC PANEL
ALT: 17 U/L (ref 0–35)
AST: 22 U/L (ref 0–37)
Albumin: 4.6 g/dL (ref 3.5–5.2)
Alkaline Phosphatase: 69 U/L (ref 39–117)
BUN: 13 mg/dL (ref 6–23)
CO2: 27 mEq/L (ref 19–32)
Calcium: 10.3 mg/dL (ref 8.4–10.5)
Chloride: 101 mEq/L (ref 96–112)
Creatinine, Ser: 1.13 mg/dL (ref 0.40–1.20)
GFR: 61.81 mL/min (ref >60.00)
Glucose, Bld: 87 mg/dL (ref 70–99)
Potassium: 4.2 mEq/L (ref 3.5–5.1)
Sodium: 137 mEq/L (ref 135–145)
Total Bilirubin: 0.6 mg/dL (ref 0.2–1.2)
Total Protein: 8.1 g/dL (ref 6.0–8.3)

## 2021-05-06 LAB — LIPID PANEL
Cholesterol: 184 mg/dL (ref 0–200)
HDL: 41.3 mg/dL (ref >39.00)
LDL Cholesterol: 123 mg/dL — ABNORMAL HIGH (ref 0–99)
NonHDL: 143.19
Total CHOL/HDL Ratio: 4
Triglycerides: 101 mg/dL (ref 0.0–149.0)
VLDL: 20.2 mg/dL (ref 0.0–40.0)

## 2021-05-06 NOTE — Progress Notes (Signed)
Chief Complaint  Patient presents with   Annual Exam     Well Woman Tina Walker is here for a complete physical.   Her last physical was >1 year ago.  Current diet: in general, a "healthy" diet. Current exercise: none. Fatigue out of ordinary? No Seatbelt? Yes Advanced directive? No  Health Maintenance Pap/HPV- Yes Tetanus- Yes HIV screening- Yes Hep C screening- No  Past Medical History:  Diagnosis Date   Hypertension      Past Surgical History:  Procedure Laterality Date   CARPAL TUNNEL RELEASE Right 04/18/2021   Procedure: RIGHT CARPAL TUNNEL RELEASE;  Surgeon: Cammy Copa, MD;  Location: [facility] OR;  Service: Orthopedics;  Laterality: Right;   CESAREAN SECTION     x 2   I & D EXTREMITY Bilateral 07/28/2020   Procedure: IRRIGATION AND DEBRIDEMENT RIGHT ANTERIOR SHOULDER AND LEFT  ANTERIOR THIGH;  Surgeon: Cammy Copa, MD;  Location[facility] OR;  Service: Orthopedics;  Laterality: Bilateral;   SHOULDER SURGERY  07/2020    Medications  Current Outpatient Medications on File Prior to Visit  Medication Sig Dispense Refill   amLODipine-valsartan (EXFORGE) 5-320 MG tablet Take 1 tablet by mouth daily. 30 tablet 2   Allergies No Known Allergies  Review of Systems: Constitutional:  no unexpected weight changes Eye:  no recent significant change in vision Ear/Nose/Mouth/Throat:  Ears:  no tinnitus or vertigo and no recent change in hearing Nose/Mouth/Throat:  no complaints of nasal congestion, no sore throat Cardiovascular: no chest pain Respiratory:  no cough and no shortness of breath Gastrointestinal:  no abdominal pain, no change in bowel habits GU:  Female: negative for dysuria or pelvic pain Musculoskeletal/Extremities:  no pain of the joints Integumentary (Skin/Breast):  no abnormal skin lesions reported Neurologic:  no headaches Endocrine:  denies fatigue Hematologic/Lymphatic:  No areas of easy bleeding  Exam BP 112/72    Pulse 90    Temp 98.1 F  (36.7 C) (Oral)    Ht 5\' 3"  (1.6 m)    Wt 175 lb 2 oz (79.4 kg)    SpO2 99%    BMI 31.02 kg/m  General:  well developed, well nourished, in no apparent distress Skin:  no significant moles, warts, or growths Head:  no masses, lesions, or tenderness Eyes:  pupils equal and round, sclera anicteric without injection Ears:  canals without lesions, TMs shiny without retraction, no obvious effusion, no erythema Nose:  nares patent, septum midline, mucosa normal, and no drainage or sinus tenderness Throat/Pharynx:  lips and gingiva without lesion; tongue and uvula midline; non-inflamed pharynx; no exudates or postnasal drainage Neck: neck supple without adenopathy, thyromegaly, or masses Lungs:  clear to auscultation, breath sounds equal bilaterally, no respiratory distress Cardio:  regular rate and rhythm, no bruits, no LE edema Abdomen:  abdomen soft, nontender; bowel sounds normal; no masses or organomegaly Genital: Defer to GYN Musculoskeletal:  symmetrical muscle groups noted without atrophy or deformity Extremities:  no clubbing, cyanosis, or edema, no deformities, no skin discoloration Neuro:  gait normal; deep tendon reflexes normal and symmetric Psych: well oriented with normal range of affect and appropriate judgment/insight  Assessment and Plan  Well adult exam - Plan: Lipid panel, CBC, Comprehensive metabolic panel  Encounter for hepatitis C screening test for low risk patient - Plan: Hepatitis C antibody   Well 40 y.o. female. Counseled on diet and exercise. Other orders as above. Politely declined flu and covid vaccine. Encouraged to reach out if she has any questions.  Stop using Q tips. Irrigation instead.  Advanced directive form provided.  Follow up in 6 mo or prn. The patient voiced understanding and agreement to the plan.  Jilda Roche [place], DO 05/06/21 7:25 AM

## 2021-05-06 NOTE — Patient Instructions (Addendum)
Give [place] 2-3 business days to get the results of your labs back.   Keep the diet clean and stay active.  OK to use Debrox (peroxide) in the ear to loosen up wax. Also recommend using a bulb syringe (for removing boogers from baby's noses) to flush through warm water and vinegar (3-4:1 ratio). An alternative, though more expensive, is an elephant ear washer wax removal kit. Do not use Q-tips as this can impact wax further.  Over the next month, monitor blood pressures at home. I want your levels consistently <140/90. If it is not, I want to know.   Let us know if you need anything.

## 2021-05-07 ENCOUNTER — Other Ambulatory Visit (INDEPENDENT_AMBULATORY_CARE_PROVIDER_SITE_OTHER): Payer: Managed Care, Other (non HMO)

## 2021-05-07 ENCOUNTER — Other Ambulatory Visit: Payer: Managed Care, Other (non HMO)

## 2021-05-07 DIAGNOSIS — D72818 Other decreased white blood cell count: Secondary | ICD-10-CM

## 2021-05-07 LAB — HEPATITIS C ANTIBODY
Hepatitis C Ab: NONREACTIVE
SIGNAL TO CUT-OFF: 0.05 (ref <1.00)

## 2021-05-07 LAB — IBC + FERRITIN
Ferritin: 71.8 ng/mL (ref 10.0–291.0)
Iron: 53 ug/dL (ref 42–145)
Saturation Ratios: 13 % — ABNORMAL LOW (ref 20.0–50.0)
TIBC: 407.4 ug/dL (ref 250.0–450.0)
Transferrin: 291 mg/dL (ref 212.0–360.0)

## 2021-05-08 ENCOUNTER — Encounter: Payer: Self-pay | Admitting: Family Medicine

## 2021-05-08 LAB — PATHOLOGIST SMEAR REVIEW

## 2021-05-13 DIAGNOSIS — G5601 Carpal tunnel syndrome, right upper limb: Secondary | ICD-10-CM

## 2021-05-14 ENCOUNTER — Encounter: Payer: Self-pay | Admitting: Family Medicine

## 2021-05-14 ENCOUNTER — Other Ambulatory Visit: Payer: Self-pay | Admitting: Family Medicine

## 2021-05-14 MED ORDER — AMLODIPINE BESYLATE 5 MG PO TABS: 5.0000 mg | ORAL_TABLET | Freq: Every day | ORAL | 1 refills | Status: AC

## 2021-08-16 ENCOUNTER — Telehealth: Payer: Managed Care, Other (non HMO) | Admitting: Nurse Practitioner

## 2021-08-16 DIAGNOSIS — Z202 Contact with and (suspected) exposure to infections with a predominantly sexual mode of transmission: Secondary | ICD-10-CM | POA: Diagnosis not present

## 2021-08-16 MED ORDER — METRONIDAZOLE 500 MG PO TABS: 500.0000 mg | ORAL_TABLET | Freq: Two times a day (BID) | ORAL | 0 refills | Status: AC

## 2021-08-16 NOTE — Patient Instructions (Signed)

## 2021-08-16 NOTE — Progress Notes (Signed)
? ?Virtual Visit Consent  ? ?Tina Walker, you are scheduled for a virtual visit with Mary-Margaret Daphine Deutscher, FNP, a [facility] provider, today.   ?  ?Just as with appointments in the office, your consent must be obtained to participate.  Your consent will be active for this visit and any virtual visit you may have with one of our providers in the next 365 days.   ?  ?If you have a MyChart account, a copy of this consent can be sent to you electronically.  All virtual visits are billed to your insurance company just like a traditional visit in the office.   ? ?As this is a virtual visit, video technology does not allow for your provider to perform a traditional examination.  This may limit your provider's ability to fully assess your condition.  If your provider identifies any concerns that need to be evaluated in person or the need to arrange testing (such as labs, EKG, etc.), we will make arrangements to do so.   ?  ?Although advances in technology are sophisticated, we cannot ensure that it will always work on either your end or our end.  If the connection with a video visit is poor, the visit may have to be switched to a telephone visit.  With either a video or telephone visit, we are not always able to ensure that we have a secure connection.    ? ?I need to obtain your verbal consent now.   Are you willing to proceed with your visit today? YES ?  ?Tina Walker has provided verbal consent on 08/16/2021 for a virtual visit (video or telephone). ?  ?Mary-Margaret Daphine Deutscher, FNP  ? ?Date: 08/16/2021 6:53 PM ? ? ?Virtual Visit via Video Note  ? ?I, Mary-Margaret Daphine Deutscher, connected with Tina Walker ([id number], 1980-07-05) on 08/16/21 at  7:00 PM EDT by a video-enabled telemedicine application and verified that I am speaking with the correct person using two identifiers. ? ?Location: ?Patient: Virtual Visit Location Patient: Home ?Provider: Virtual Visit Location Provider: Mobile ?  ?I discussed the limitations of  evaluation and management by telemedicine and the availability of in person appointments. The patient expressed understanding and agreed to proceed.   ? ?History of Present Illness: ?Tina Walker is a 41 y.o. who identifies as a female who was assigned female at birth, and is being seen today for std exosure. ? ?HPI: Patient states her boyfriend called her and told her he had trichomoniasis and was being treated. She denies any vaginal discharge itching or urinary frequency. ?  ?Review of Systems  ?Constitutional:  Negative for diaphoresis and weight loss.  ?Eyes:  Negative for blurred vision, double vision and pain.  ?Respiratory:  Negative for shortness of breath.   ?Cardiovascular:  Negative for chest pain, palpitations, orthopnea and leg swelling.  ?Gastrointestinal:  Negative for abdominal pain.  ?Genitourinary:  Negative for dysuria, flank pain and frequency.  ?Skin:  Negative for rash.  ?Neurological:  Negative for dizziness, sensory change, loss of consciousness, weakness and headaches.  ?Endo/Heme/Allergies:  Negative for polydipsia. Does not bruise/bleed easily.  ?Psychiatric/Behavioral:  Negative for memory loss. The patient does not have insomnia.   ?All other systems reviewed and are negative. ? ?Problems:  ?Patient Active Problem List  ? Diagnosis Date Noted  ? Carpal tunnel syndrome, right upper limb   ? Open wound of right upper arm due to dog bite   ? Dog bite of left thigh without complication   ? Dog bite 07/29/2020  ?  Essential hypertension   ? Status post surgery 07/28/2020  ?  ?Allergies: No Known Allergies ?Medications:  ?Current Outpatient Medications:  ?  amLODipine (NORVASC) 5 MG tablet, Take 1 tablet (5 mg total) by mouth daily., Disp: 90 tablet, Rfl: 1 ? ?Observations/Objective: ?Patient is well-developed, well-nourished in no acute distress.  ?Resting comfortably  at home.  ?Head is normocephalic, atraumatic.  ?No labored breathing.  ?Speech is clear and coherent with logical content.   ?Patient is alert and oriented at baseline.  ? ? ?Assessment and Plan: ? ?Tina Walker in today with chief complaint of Exposure to STD ? ? ?1. Trichomonas exposure ?Safe sex reviewed ?Transmission of trich discussed ?Medication side effects discussed ? ?Meds ordered this encounter  ?Medications  ? metroNIDAZOLE (FLAGYL) 500 MG tablet  ?  Sig: Take 1 tablet (500 mg total) by mouth 2 (two) times daily.  ?  Dispense:  14 tablet  ?  Refill:  0  ?  Order Specific Question:   Supervising Provider  ?  Answer:   Eber Hong [[id number]]  ? ? ? ? ?Follow Up Instructions: ?I discussed the assessment and treatment plan with the patient. The patient was provided an opportunity to ask questions and all were answered. The patient agreed with the plan and demonstrated an understanding of the instructions.  A copy of instructions were sent to the patient via MyChart. ? ?The patient was advised to call back or seek an in-person evaluation if the symptoms worsen or if the condition fails to improve as anticipated. ? ?Time:  ?I spent 8 minutes with the patient via telehealth technology discussing the above problems/concerns.   ? ?Mary-Margaret Daphine Deutscher, FNP ? ?

## 2021-09-27 ENCOUNTER — Telehealth: Payer: Self-pay | Admitting: Surgical

## 2021-09-27 NOTE — Telephone Encounter (Signed)
Called pt 1x and left vm. Pt left a mychart and asking to be seen by PA [facility] for hand pin. Did not specify what hand. Please make appt for this pt when she calls back. ?

## 2021-09-30 NOTE — Telephone Encounter (Signed)
Needs repeat eval

## 2022-03-16 IMAGING — DX DG FEMUR 1V*L*
1 series · 2 of 2 positions shown · non-contrast
Comparison: None.

CLINICAL DATA: Dog bite

EXAM:
LEFT FEMUR 1 VIEW

[Series 1: femur · 0.14mm/px · 2 of 2 slices shown]
[im 1/2]
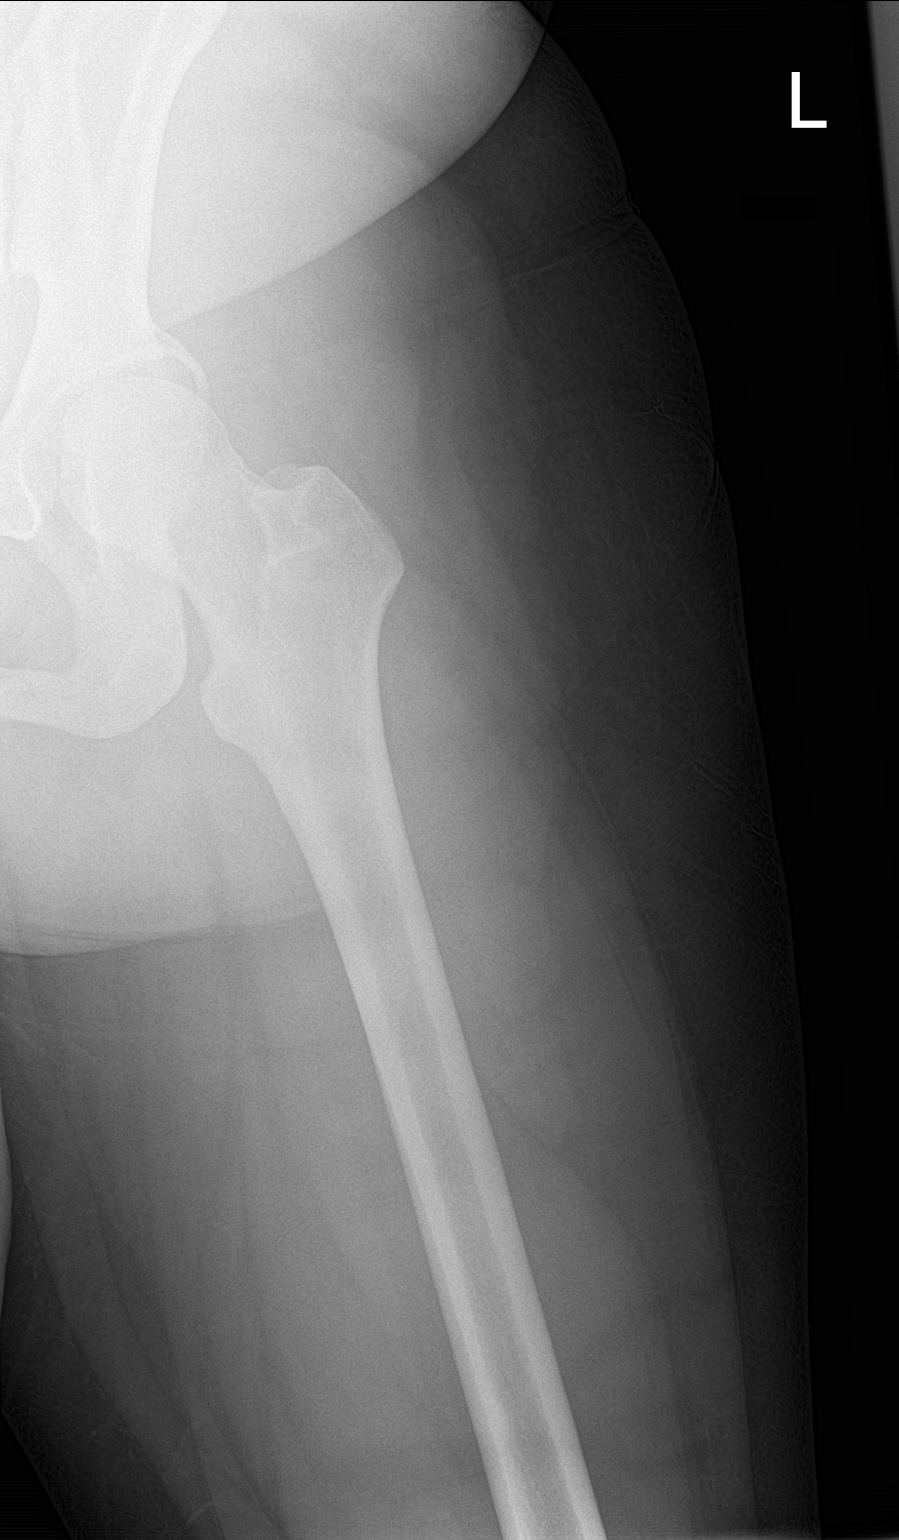
[im 2/2]
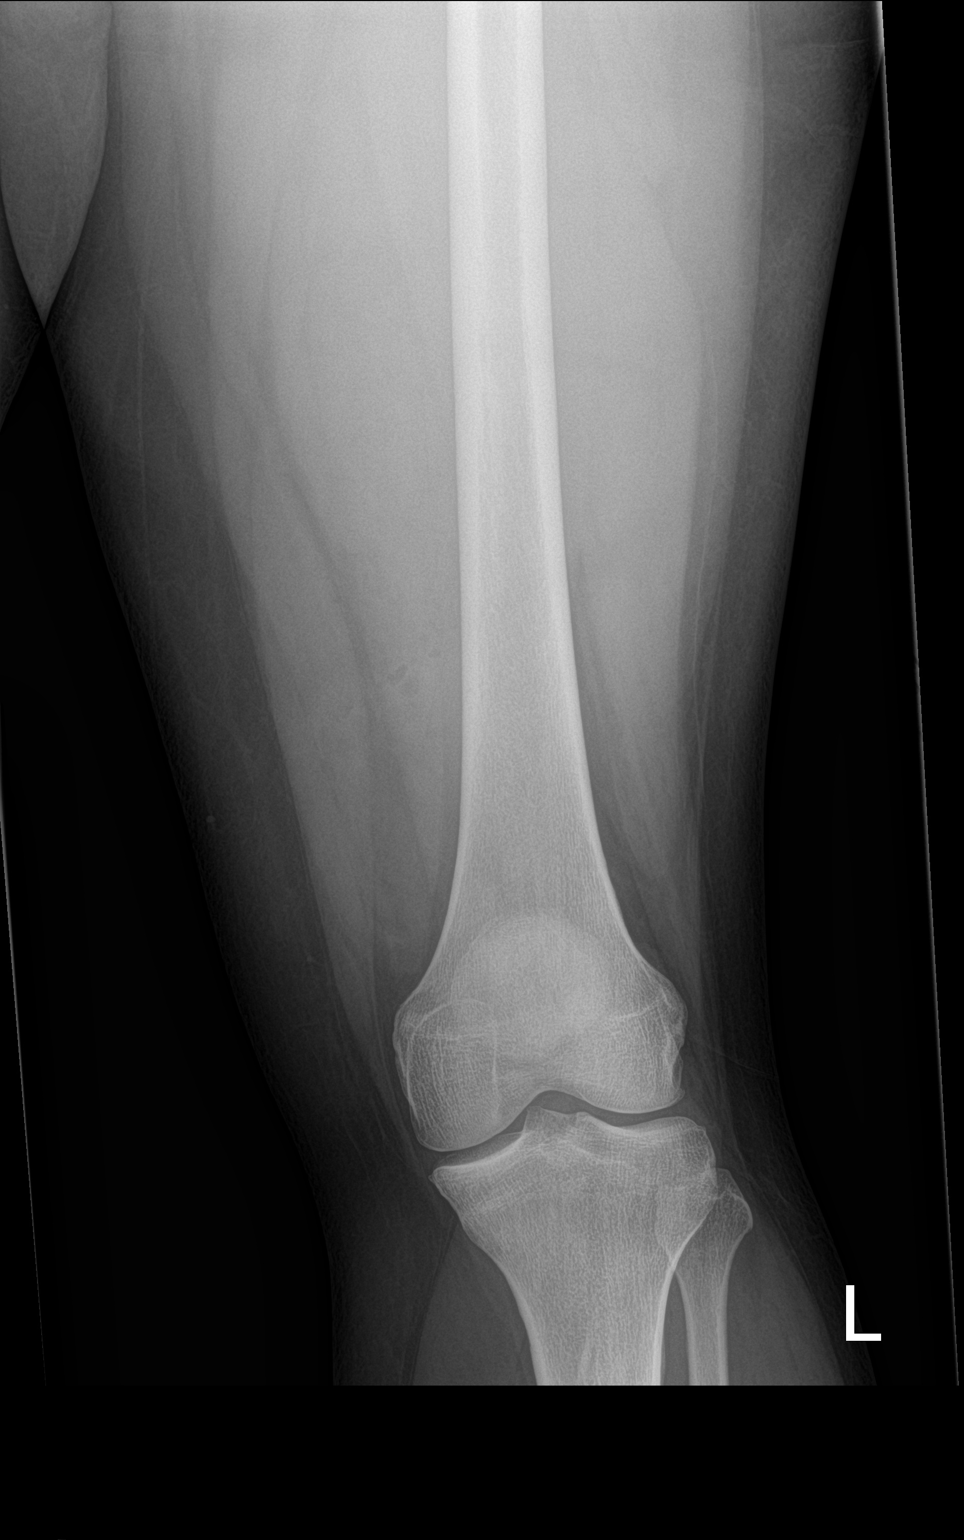

[2 of 2 positions shown; findings below may reference images not displayed]

FINDINGS: There is no evidence of fracture or other focal bone lesions. Soft
tissues are unremarkable.
IMPRESSION: No fracture or dislocation of the left femur.

## 2022-03-16 IMAGING — DX DG SHOULDER 2+V*R*
1 series · 3 of 3 positions shown · non-contrast
Comparison: None.

CLINICAL DATA: Dog bite

EXAM:
RIGHT SHOULDER - 2+ VIEW

[Series 1: shoulder · 0.14mm/px · 3 of 3 slices shown]
[im 1/3]
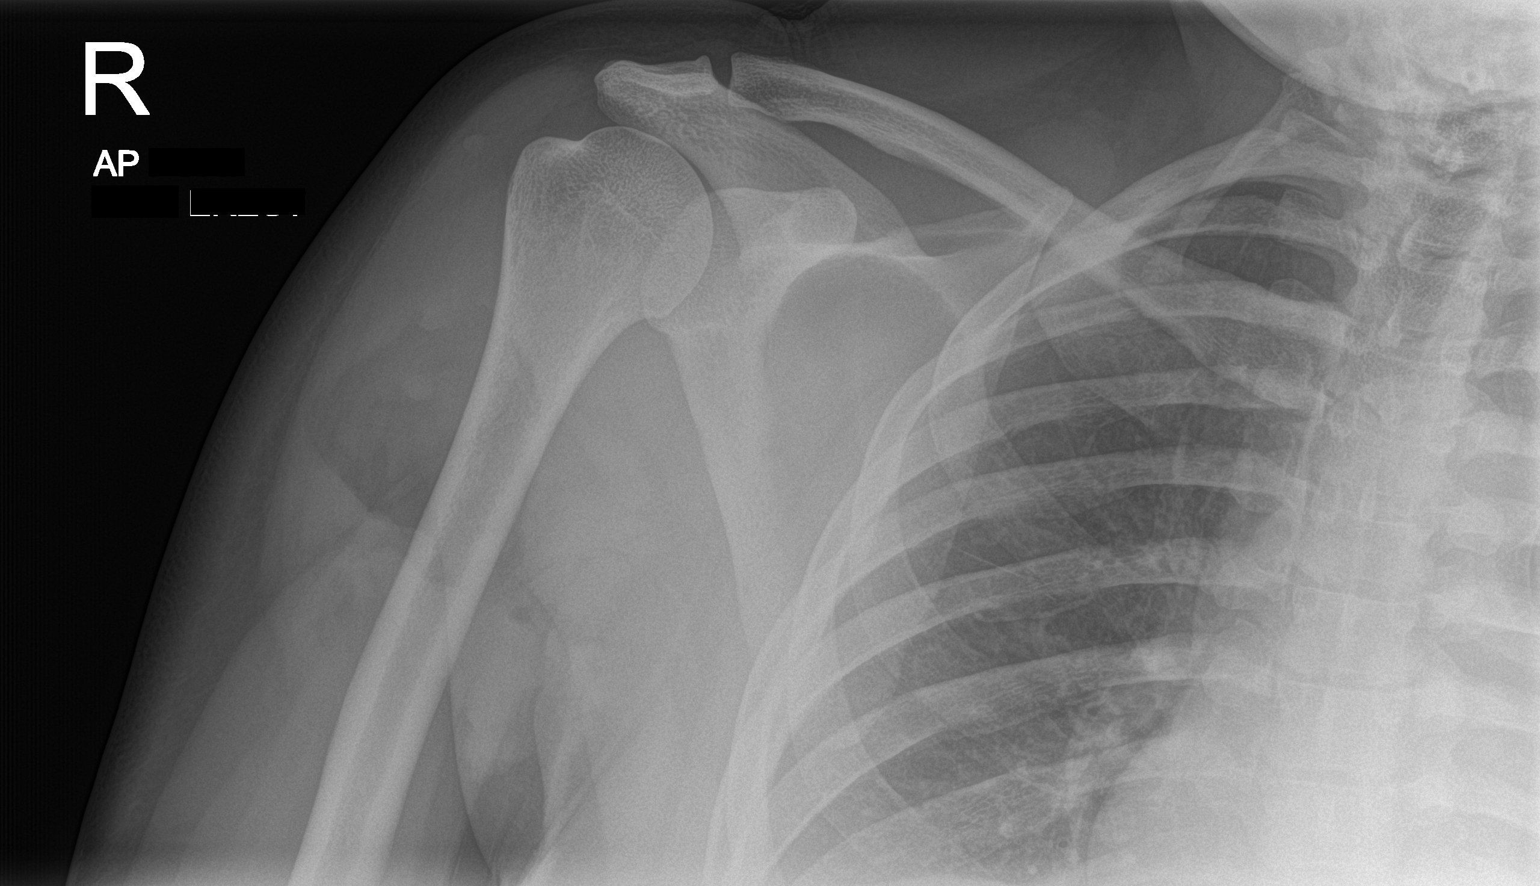
[im 2/3]
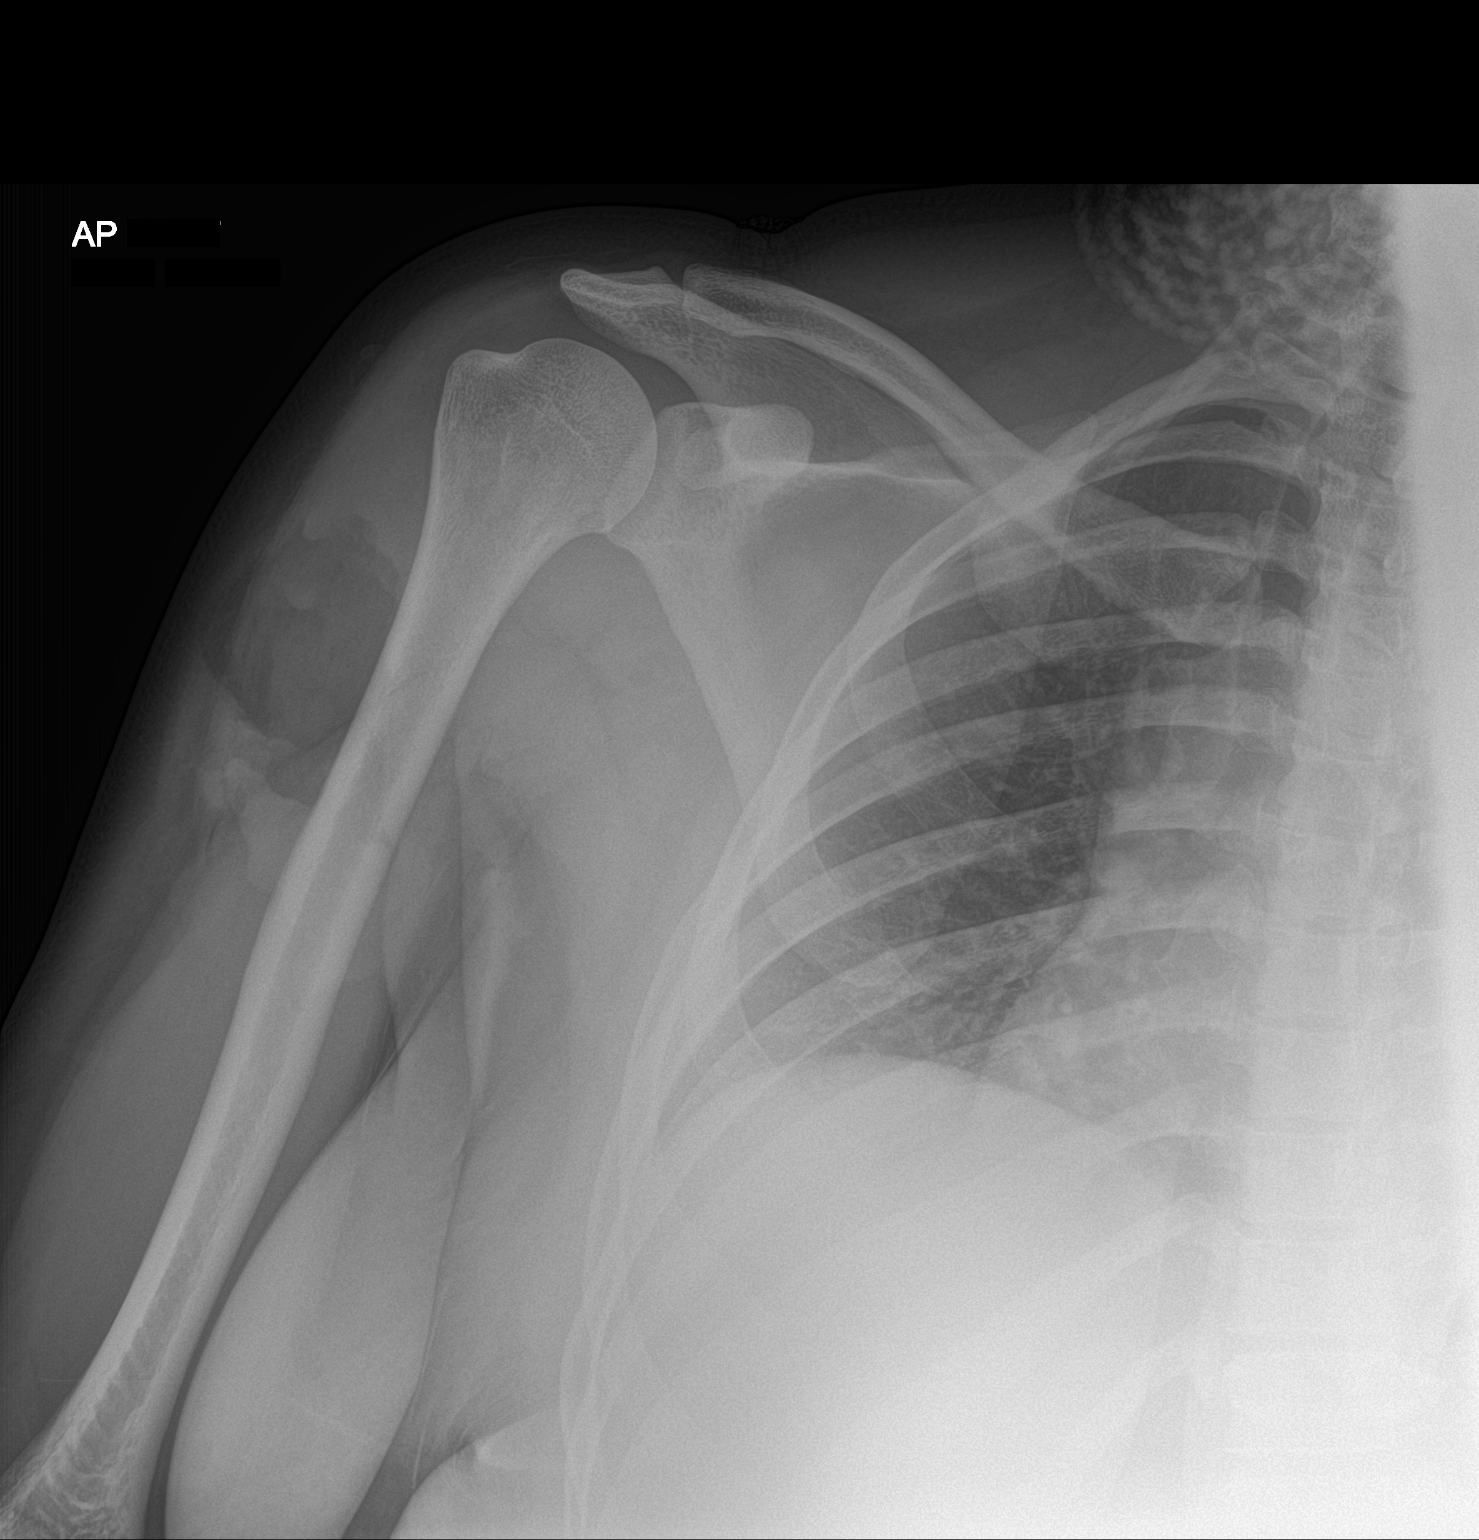
[im 3/3]
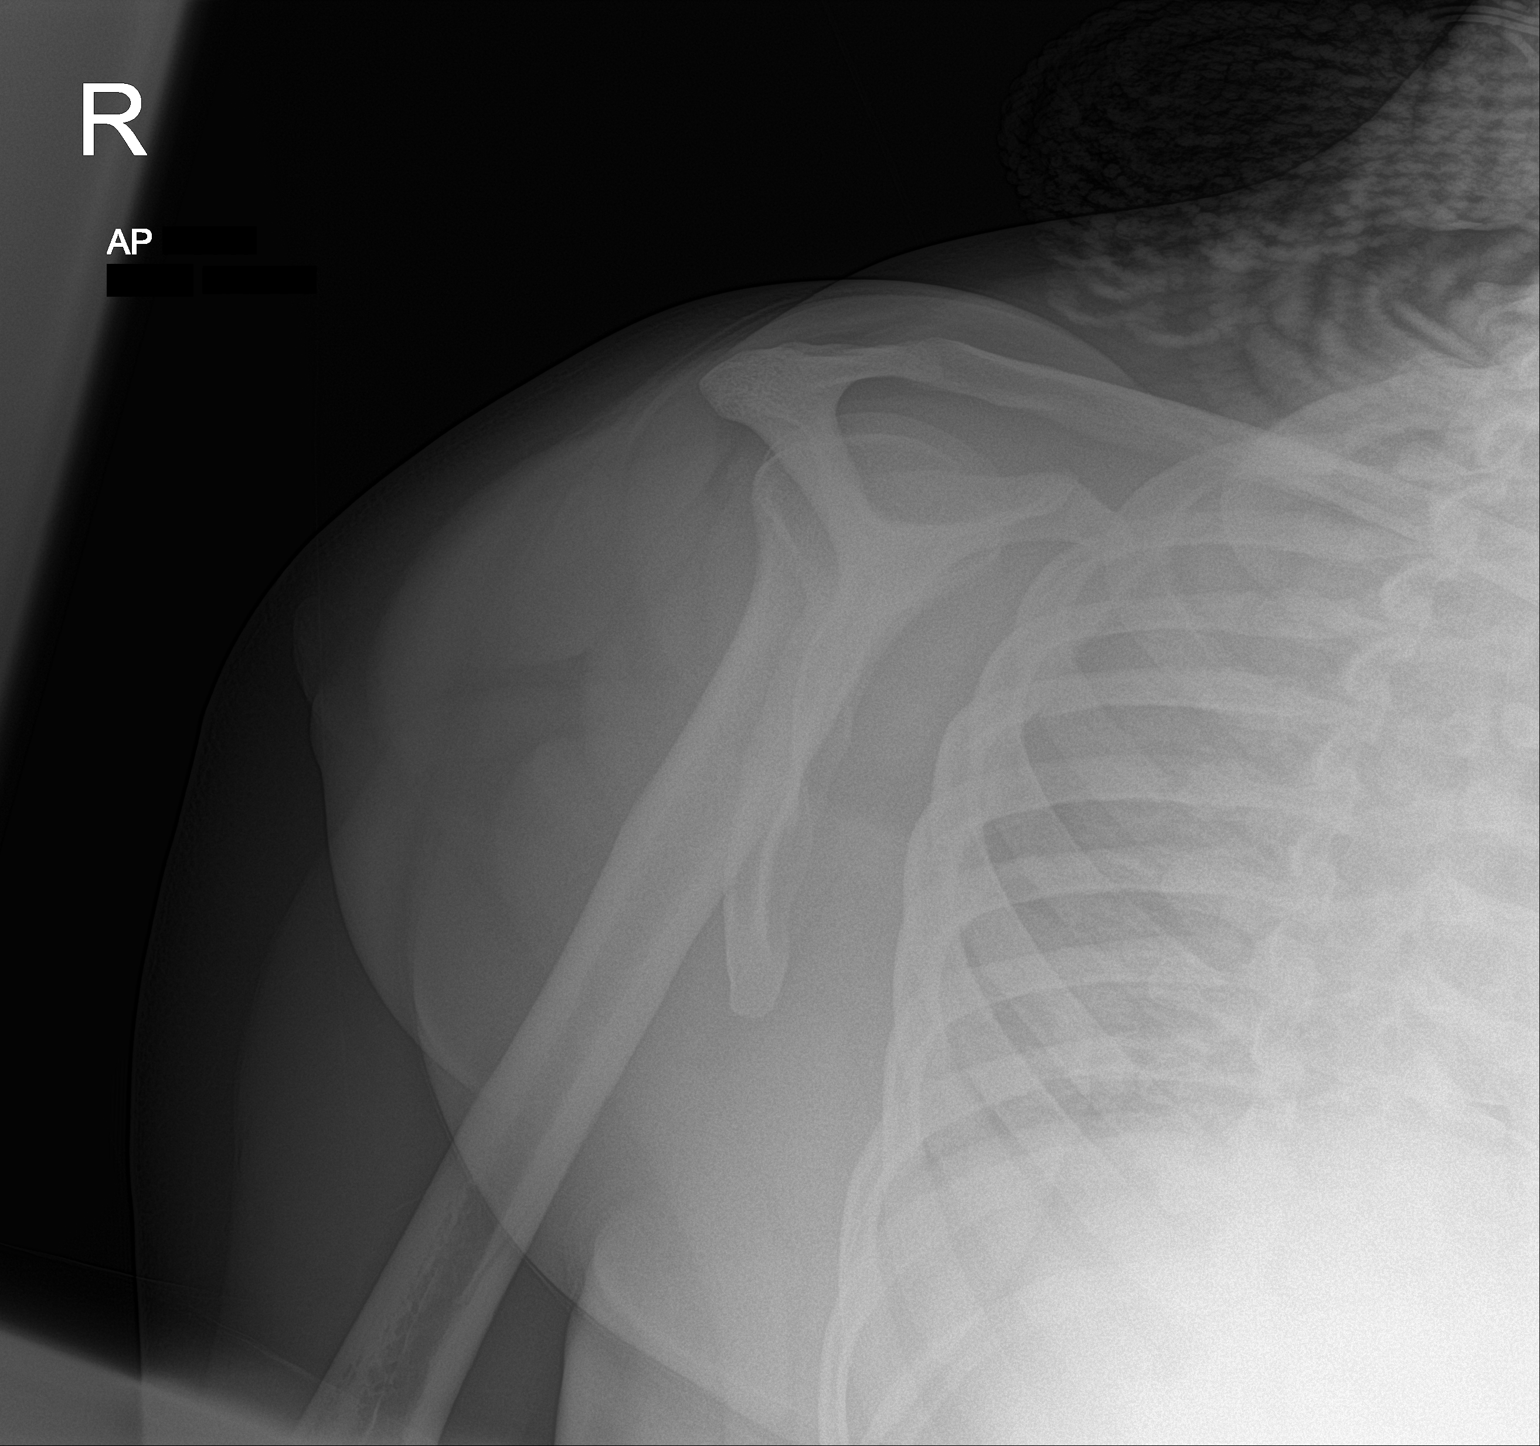

[3 of 3 positions shown; findings below may reference images not displayed]

FINDINGS: There is no evidence of fracture or dislocation. There is no
evidence of arthropathy or other focal bone abnormality. Large soft
tissue defect overlying the right upper arm.
IMPRESSION: 1. No fracture or dislocation of the right shoulder. Joint spaces
are well preserved.
2. Large soft tissue defect overlying the right upper arm.

## 2022-05-09 ENCOUNTER — Encounter: Payer: Managed Care, Other (non HMO) | Admitting: Family Medicine

## 2024-06-22 LAB — LAB REPORT - SCANNED
